# Patient Record
Sex: Female | Born: 1955 | Race: White | Hispanic: No | Marital: Married | Smoking: Never smoker
Health system: Southern US, Community
[De-identification: ages and names within clinical notes are randomized; demographics above are authoritative.]

## PROBLEM LIST (undated history)

## (undated) DIAGNOSIS — E049 Nontoxic goiter, unspecified: Secondary | ICD-10-CM

## (undated) DIAGNOSIS — E785 Hyperlipidemia, unspecified: Secondary | ICD-10-CM

## (undated) DIAGNOSIS — E6609 Other obesity due to excess calories: Secondary | ICD-10-CM

## (undated) DIAGNOSIS — D649 Anemia, unspecified: Secondary | ICD-10-CM

## (undated) DIAGNOSIS — I350 Nonrheumatic aortic (valve) stenosis: Secondary | ICD-10-CM

## (undated) DIAGNOSIS — I8393 Asymptomatic varicose veins of bilateral lower extremities: Secondary | ICD-10-CM

## (undated) DIAGNOSIS — D369 Benign neoplasm, unspecified site: Secondary | ICD-10-CM

## (undated) DIAGNOSIS — M1990 Unspecified osteoarthritis, unspecified site: Secondary | ICD-10-CM

## (undated) DIAGNOSIS — E042 Nontoxic multinodular goiter: Secondary | ICD-10-CM

## (undated) DIAGNOSIS — E039 Hypothyroidism, unspecified: Secondary | ICD-10-CM

## (undated) DIAGNOSIS — J189 Pneumonia, unspecified organism: Secondary | ICD-10-CM

## (undated) HISTORY — PX: WISDOM TOOTH EXTRACTION: SHX21

## (undated) HISTORY — DX: Anemia, unspecified: D64.9

## (undated) HISTORY — DX: Other obesity due to excess calories: E66.09

## (undated) HISTORY — DX: Hypothyroidism, unspecified: E03.9

## (undated) HISTORY — DX: Asymptomatic varicose veins of bilateral lower extremities: I83.93

## (undated) HISTORY — DX: Hyperlipidemia, unspecified: E78.5

## (undated) HISTORY — DX: Benign neoplasm, unspecified site: D36.9

## (undated) HISTORY — DX: Nontoxic multinodular goiter: E04.2

## (undated) HISTORY — DX: Nontoxic goiter, unspecified: E04.9

## (undated) HISTORY — DX: Nonrheumatic aortic (valve) stenosis: I35.0

## (undated) HISTORY — PX: COLONOSCOPY: SHX174

---

## 1998-06-29 ENCOUNTER — Other Ambulatory Visit: Admission: RE | Admit: 1998-06-29 | Discharge: 1998-06-29 | Payer: Self-pay | Admitting: Obstetrics and Gynecology

## 1998-09-20 ENCOUNTER — Ambulatory Visit (HOSPITAL_COMMUNITY): Admission: RE | Admit: 1998-09-20 | Discharge: 1998-09-20 | Payer: Self-pay | Admitting: Obstetrics and Gynecology

## 1998-09-20 ENCOUNTER — Encounter: Payer: Self-pay | Admitting: Obstetrics and Gynecology

## 1998-10-16 ENCOUNTER — Encounter: Payer: Self-pay | Admitting: Obstetrics and Gynecology

## 1998-10-16 ENCOUNTER — Ambulatory Visit (HOSPITAL_COMMUNITY): Admission: RE | Admit: 1998-10-16 | Discharge: 1998-10-16 | Payer: Self-pay | Admitting: Obstetrics and Gynecology

## 1999-08-08 ENCOUNTER — Other Ambulatory Visit: Admission: RE | Admit: 1999-08-08 | Discharge: 1999-08-08 | Payer: Self-pay | Admitting: Obstetrics and Gynecology

## 1999-10-17 ENCOUNTER — Encounter: Payer: Self-pay | Admitting: Obstetrics and Gynecology

## 1999-10-17 ENCOUNTER — Ambulatory Visit (HOSPITAL_COMMUNITY): Admission: RE | Admit: 1999-10-17 | Discharge: 1999-10-17 | Payer: Self-pay | Admitting: Obstetrics and Gynecology

## 2000-09-28 ENCOUNTER — Other Ambulatory Visit: Admission: RE | Admit: 2000-09-28 | Discharge: 2000-09-28 | Payer: Self-pay | Admitting: Obstetrics and Gynecology

## 2000-10-21 ENCOUNTER — Encounter: Payer: Self-pay | Admitting: Obstetrics and Gynecology

## 2000-10-21 ENCOUNTER — Ambulatory Visit (HOSPITAL_COMMUNITY): Admission: RE | Admit: 2000-10-21 | Discharge: 2000-10-21 | Payer: Self-pay | Admitting: Obstetrics and Gynecology

## 2001-10-20 ENCOUNTER — Other Ambulatory Visit: Admission: RE | Admit: 2001-10-20 | Discharge: 2001-10-20 | Payer: Self-pay | Admitting: Obstetrics and Gynecology

## 2001-10-26 ENCOUNTER — Ambulatory Visit (HOSPITAL_COMMUNITY): Admission: RE | Admit: 2001-10-26 | Discharge: 2001-10-26 | Payer: Self-pay | Admitting: Obstetrics and Gynecology

## 2001-10-26 ENCOUNTER — Encounter: Payer: Self-pay | Admitting: Obstetrics and Gynecology

## 2002-03-23 ENCOUNTER — Ambulatory Visit (HOSPITAL_COMMUNITY): Admission: RE | Admit: 2002-03-23 | Discharge: 2002-03-23 | Payer: Self-pay | Admitting: Obstetrics and Gynecology

## 2002-03-23 ENCOUNTER — Encounter: Payer: Self-pay | Admitting: Obstetrics and Gynecology

## 2002-10-31 ENCOUNTER — Encounter: Payer: Self-pay | Admitting: Obstetrics and Gynecology

## 2002-10-31 ENCOUNTER — Ambulatory Visit (HOSPITAL_COMMUNITY): Admission: RE | Admit: 2002-10-31 | Discharge: 2002-10-31 | Payer: Self-pay | Admitting: Internal Medicine

## 2002-11-16 ENCOUNTER — Other Ambulatory Visit: Admission: RE | Admit: 2002-11-16 | Discharge: 2002-11-16 | Payer: Self-pay | Admitting: Obstetrics and Gynecology

## 2003-11-20 ENCOUNTER — Other Ambulatory Visit: Admission: RE | Admit: 2003-11-20 | Discharge: 2003-11-20 | Payer: Self-pay | Admitting: Obstetrics and Gynecology

## 2003-11-23 ENCOUNTER — Ambulatory Visit (HOSPITAL_COMMUNITY): Admission: RE | Admit: 2003-11-23 | Discharge: 2003-11-23 | Payer: Self-pay | Admitting: Obstetrics and Gynecology

## 2005-02-11 ENCOUNTER — Other Ambulatory Visit: Admission: RE | Admit: 2005-02-11 | Discharge: 2005-02-11 | Payer: Self-pay | Admitting: Obstetrics and Gynecology

## 2005-03-10 ENCOUNTER — Ambulatory Visit (HOSPITAL_COMMUNITY): Admission: RE | Admit: 2005-03-10 | Discharge: 2005-03-10 | Payer: Self-pay | Admitting: Obstetrics and Gynecology

## 2006-04-08 ENCOUNTER — Ambulatory Visit (HOSPITAL_COMMUNITY): Admission: RE | Admit: 2006-04-08 | Discharge: 2006-04-08 | Payer: Self-pay | Admitting: Obstetrics and Gynecology

## 2007-05-12 ENCOUNTER — Ambulatory Visit (HOSPITAL_COMMUNITY): Admission: RE | Admit: 2007-05-12 | Discharge: 2007-05-12 | Payer: Self-pay | Admitting: Obstetrics and Gynecology

## 2007-05-17 ENCOUNTER — Encounter: Admission: RE | Admit: 2007-05-17 | Discharge: 2007-05-17 | Payer: Self-pay | Admitting: Obstetrics and Gynecology

## 2007-11-18 ENCOUNTER — Encounter: Admission: RE | Admit: 2007-11-18 | Discharge: 2007-11-18 | Payer: Self-pay | Admitting: Obstetrics and Gynecology

## 2008-05-22 ENCOUNTER — Encounter: Admission: RE | Admit: 2008-05-22 | Discharge: 2008-05-22 | Payer: Self-pay | Admitting: Obstetrics and Gynecology

## 2009-04-23 ENCOUNTER — Encounter: Admission: RE | Admit: 2009-04-23 | Discharge: 2009-04-23 | Payer: Self-pay | Admitting: Family Medicine

## 2009-06-21 ENCOUNTER — Encounter: Admission: RE | Admit: 2009-06-21 | Discharge: 2009-06-21 | Payer: Self-pay | Admitting: Obstetrics and Gynecology

## 2010-07-26 ENCOUNTER — Other Ambulatory Visit (HOSPITAL_COMMUNITY): Payer: Self-pay | Admitting: Obstetrics and Gynecology

## 2010-07-26 DIAGNOSIS — Z1231 Encounter for screening mammogram for malignant neoplasm of breast: Secondary | ICD-10-CM

## 2010-07-30 ENCOUNTER — Other Ambulatory Visit: Payer: Self-pay | Admitting: Obstetrics and Gynecology

## 2010-07-30 ENCOUNTER — Ambulatory Visit (HOSPITAL_COMMUNITY): Payer: Self-pay

## 2010-07-30 ENCOUNTER — Ambulatory Visit
Admission: RE | Admit: 2010-07-30 | Discharge: 2010-07-30 | Disposition: A | Discharge status: Home / Self Care | Payer: BC Managed Care – PPO | Source: Ambulatory Visit | Attending: Obstetrics and Gynecology | Admitting: Obstetrics and Gynecology

## 2010-07-30 DIAGNOSIS — Z1231 Encounter for screening mammogram for malignant neoplasm of breast: Secondary | ICD-10-CM

## 2010-07-31 ENCOUNTER — Ambulatory Visit: Payer: Self-pay

## 2011-05-05 ENCOUNTER — Ambulatory Visit: Payer: Self-pay | Admitting: Obstetrics and Gynecology

## 2011-06-04 ENCOUNTER — Ambulatory Visit (INDEPENDENT_AMBULATORY_CARE_PROVIDER_SITE_OTHER): Payer: BC Managed Care – PPO | Admitting: Obstetrics and Gynecology

## 2011-06-04 DIAGNOSIS — Z01419 Encounter for gynecological examination (general) (routine) without abnormal findings: Secondary | ICD-10-CM

## 2011-08-14 ENCOUNTER — Other Ambulatory Visit: Payer: Self-pay | Admitting: Obstetrics and Gynecology

## 2011-08-14 DIAGNOSIS — Z1231 Encounter for screening mammogram for malignant neoplasm of breast: Secondary | ICD-10-CM

## 2011-08-26 ENCOUNTER — Ambulatory Visit
Admission: RE | Admit: 2011-08-26 | Discharge: 2011-08-26 | Disposition: A | Discharge status: Home / Self Care | Payer: BC Managed Care – PPO | Source: Ambulatory Visit | Attending: Obstetrics and Gynecology | Admitting: Obstetrics and Gynecology

## 2011-08-26 DIAGNOSIS — Z1231 Encounter for screening mammogram for malignant neoplasm of breast: Secondary | ICD-10-CM

## 2011-08-28 ENCOUNTER — Other Ambulatory Visit: Payer: Self-pay | Admitting: Obstetrics and Gynecology

## 2011-08-28 DIAGNOSIS — R928 Other abnormal and inconclusive findings on diagnostic imaging of breast: Secondary | ICD-10-CM

## 2011-09-05 ENCOUNTER — Ambulatory Visit
Admission: RE | Admit: 2011-09-05 | Discharge: 2011-09-05 | Disposition: A | Discharge status: Home / Self Care | Payer: BC Managed Care – PPO | Source: Ambulatory Visit | Attending: Obstetrics and Gynecology | Admitting: Obstetrics and Gynecology

## 2011-09-05 DIAGNOSIS — R928 Other abnormal and inconclusive findings on diagnostic imaging of breast: Secondary | ICD-10-CM

## 2011-09-25 ENCOUNTER — Encounter: Payer: Self-pay | Admitting: Obstetrics and Gynecology

## 2012-09-20 ENCOUNTER — Other Ambulatory Visit: Payer: Self-pay

## 2012-09-20 DIAGNOSIS — Z1231 Encounter for screening mammogram for malignant neoplasm of breast: Secondary | ICD-10-CM

## 2012-10-12 ENCOUNTER — Ambulatory Visit
Admission: RE | Admit: 2012-10-12 | Discharge: 2012-10-12 | Disposition: A | Discharge status: Home / Self Care | Payer: BC Managed Care – PPO | Source: Ambulatory Visit

## 2012-10-12 DIAGNOSIS — Z1231 Encounter for screening mammogram for malignant neoplasm of breast: Secondary | ICD-10-CM

## 2013-08-18 ENCOUNTER — Other Ambulatory Visit: Payer: Self-pay | Admitting: Family Medicine

## 2013-08-18 ENCOUNTER — Ambulatory Visit
Admission: RE | Admit: 2013-08-18 | Discharge: 2013-08-18 | Disposition: A | Discharge status: Home / Self Care | Payer: BC Managed Care – PPO | Source: Ambulatory Visit | Attending: Family Medicine | Admitting: Family Medicine

## 2013-08-18 DIAGNOSIS — S93401A Sprain of unspecified ligament of right ankle, initial encounter: Secondary | ICD-10-CM

## 2013-09-21 ENCOUNTER — Ambulatory Visit
Admission: RE | Admit: 2013-09-21 | Discharge: 2013-09-21 | Disposition: A | Discharge status: Home / Self Care | Payer: BC Managed Care – PPO | Source: Ambulatory Visit | Attending: Family Medicine | Admitting: Family Medicine

## 2013-09-21 ENCOUNTER — Other Ambulatory Visit: Payer: Self-pay | Admitting: Family Medicine

## 2013-09-21 DIAGNOSIS — M546 Pain in thoracic spine: Secondary | ICD-10-CM

## 2013-10-07 ENCOUNTER — Other Ambulatory Visit: Payer: Self-pay

## 2013-10-07 DIAGNOSIS — Z1231 Encounter for screening mammogram for malignant neoplasm of breast: Secondary | ICD-10-CM

## 2013-10-14 ENCOUNTER — Ambulatory Visit
Admission: RE | Admit: 2013-10-14 | Discharge: 2013-10-14 | Disposition: A | Discharge status: Home / Self Care | Payer: BC Managed Care – PPO | Source: Ambulatory Visit

## 2013-10-14 DIAGNOSIS — Z1231 Encounter for screening mammogram for malignant neoplasm of breast: Secondary | ICD-10-CM

## 2014-10-09 ENCOUNTER — Encounter (INDEPENDENT_AMBULATORY_CARE_PROVIDER_SITE_OTHER): Payer: BC Managed Care – PPO | Admitting: Ophthalmology

## 2014-10-09 DIAGNOSIS — H31003 Unspecified chorioretinal scars, bilateral: Secondary | ICD-10-CM | POA: Diagnosis not present

## 2014-10-09 DIAGNOSIS — H43813 Vitreous degeneration, bilateral: Secondary | ICD-10-CM

## 2017-09-08 ENCOUNTER — Other Ambulatory Visit (HOSPITAL_COMMUNITY): Payer: Self-pay | Admitting: Family Medicine

## 2017-09-08 DIAGNOSIS — R011 Cardiac murmur, unspecified: Secondary | ICD-10-CM

## 2017-09-10 ENCOUNTER — Ambulatory Visit (HOSPITAL_COMMUNITY): Payer: BC Managed Care – PPO | Attending: Cardiology

## 2017-09-10 ENCOUNTER — Other Ambulatory Visit: Payer: Self-pay

## 2017-09-10 ENCOUNTER — Encounter (INDEPENDENT_AMBULATORY_CARE_PROVIDER_SITE_OTHER): Payer: Self-pay

## 2017-09-10 DIAGNOSIS — I080 Rheumatic disorders of both mitral and aortic valves: Secondary | ICD-10-CM | POA: Insufficient documentation

## 2017-09-10 DIAGNOSIS — R011 Cardiac murmur, unspecified: Secondary | ICD-10-CM

## 2017-09-10 DIAGNOSIS — I77810 Thoracic aortic ectasia: Secondary | ICD-10-CM | POA: Insufficient documentation

## 2017-09-18 DIAGNOSIS — I350 Nonrheumatic aortic (valve) stenosis: Secondary | ICD-10-CM | POA: Insufficient documentation

## 2017-09-21 ENCOUNTER — Encounter: Payer: Self-pay | Admitting: Cardiovascular Disease

## 2017-09-21 ENCOUNTER — Ambulatory Visit (INDEPENDENT_AMBULATORY_CARE_PROVIDER_SITE_OTHER): Payer: BC Managed Care – PPO | Admitting: Cardiovascular Disease

## 2017-09-21 VITALS — BP 127/89 | HR 80 | Ht 66.0 in | Wt 218.0 lb

## 2017-09-21 DIAGNOSIS — E6609 Other obesity due to excess calories: Secondary | ICD-10-CM

## 2017-09-21 DIAGNOSIS — I350 Nonrheumatic aortic (valve) stenosis: Secondary | ICD-10-CM | POA: Diagnosis not present

## 2017-09-21 DIAGNOSIS — E039 Hypothyroidism, unspecified: Secondary | ICD-10-CM | POA: Diagnosis not present

## 2017-09-21 DIAGNOSIS — Z6835 Body mass index (BMI) 35.0-35.9, adult: Secondary | ICD-10-CM

## 2017-09-21 DIAGNOSIS — E7800 Pure hypercholesterolemia, unspecified: Secondary | ICD-10-CM

## 2017-09-21 DIAGNOSIS — Z01812 Encounter for preprocedural laboratory examination: Secondary | ICD-10-CM

## 2017-09-21 MED ORDER — METOPROLOL TARTRATE 50 MG PO TABS: ORAL_TABLET | ORAL | 0 refills | Status: DC

## 2017-09-21 NOTE — Patient Instructions (Signed)
Medication Instructions:  Your physician recommends that you continue on your current medications as directed. Please refer to the Current Medication list given to you today.  Labwork: Please return for labs 1 WEEK PRIOR TO CT (BMET)  Our in office lab hours are Monday-Friday 8:00-4:00, closed for lunch 12:45-1:45 pm.  No appointment needed.  Testing/Procedures: Your physician has requested that you have an echocardiogram in 3 months. Echocardiography is a painless test that uses sound waves to create images of your heart. It provides your doctor with information about the size and shape of your heart and how well your heart's chambers and valves are working. This procedure takes approximately one hour. There are no restrictions for this procedure. This will be done at our [facility] location:  [place] has requested that you have cardiac CT. Cardiac computed tomography (CT) is a painless test that uses an x-ray machine to take clear, detailed pictures of your heart. For further information please visit [email or web address]. Please follow instruction sheet as given.  Follow-Up: 3 months (after echo) with Dr. Claiborne Billings  Any Other Special Instructions Will Be Listed Below (If Applicable).     If you need a refill on your cardiac medications before your next appointment, please call your pharmacy.

## 2017-09-21 NOTE — Progress Notes (Signed)
Cardiology Office Note    Date:  09/26/2017   ID:  Megan Duke, DOB 05-07-1955, MRN [id number]  PCP:  Harlan Stains, MD  Cardiologist:  Shelva Majestic, MD   Chief Complaint  Patient presents with  . New Cardiology evaluation referred through the courtesy of Dr. Harlan Stains    History of Present Illness:  Megan Duke is a 62 y.o. female who is referred by Dr. Harlan Stains after an echo Doppler study has suggested severe aortic valve stenosis.   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  She has been active her whole life.  She cuts the grass.  She walks.  She denies any symptoms of chest pain, shortness of breath, presyncope or syncope.  She states that she had previously never been told of having any significant heart murmur but the murmur was first mentioned when she was evaluated in [place] approximately 1 year ago.  No work-up was undertaken.  She recently saw  Dr. Dema Severin and her onmost recent evaluation she was found to have a 2/6 systolic murmur radiating to her carotids.  At that time she states that she had been walking at least 3 times per week for 30 to 45 minutes without change compared to previously.  She underwent a 2D echo Doppler study on September 10, 2017.which showed normal LV function with mild LVH.  There was grade 1 diastolic dysfunction.  Aortic valve had reduced excursion and had moderately calcified leaflets.  Mean gradient was 40, peak gradient 67.  The aortic root was 39 mm.  She had mild aortic insufficiency.  Pulmonary pressures were normal.  Because of her echocardiographic findings, she now presents for cardiology evaluation.  Patient admits to purposeful weight loss over the past year.  She denies any chest pain, change in exercise tolerance, presyncope/syncope, PND orthopnea or exertional dyspnea.  Past Medical History:  Diagnosis Date  . Adenomatous polyp   . Anemia   . Dyslipidemia   . Hypothyroidism   .  Multinodular goiter   . Non morbid obesity due to excess calories   . Nontoxic goiter   . Severe aortic stenosis   . Varicose veins of both lower extremities     Past Surgical History:  Procedure Laterality Date  . COLONOSCOPY    . WISDOM TOOTH EXTRACTION      Current Medications: Outpatient Medications Prior to Visit  Medication Sig Dispense Refill  . levothyroxine (SYNTHROID, LEVOTHROID) 50 MCG tablet Take 1 tablet by mouth every morning.     No facility-administered medications prior to visit.      Allergies:   No drug allergies.  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 Marital status: Married    Spouse name: Not on file  . Number of children: Not on file  . Years of education: Not on file  . Highest education level: Not on file  Occupational History  . Not on file  Social Needs  . Financial resource strain: Not on file  . Food insecurity:    Worry: Not on file    Inability: Not on file  . Transportation needs:    Medical: Not on file    Non-medical: Not on file  Tobacco Use  . Smoking status: Never Smoker  . Smokeless tobacco: Never Used  Substance and Sexual Activity  . Alcohol use: Not on file  . Drug use: Not on file  . Sexual activity: Not on  file  Lifestyle  . Physical activity:    Days per week: Not on file    Minutes per session: Not on file  . Stress: Not on file  Relationships  . Social connections:    Talks on phone: Not on file    Gets together: Not on file    Attends religious service: Not on file    Active member of club or organization: Not on file    Attends meetings of clubs or organizations: Not on file    Relationship status: Not on file  Other Topics Concern  . Not on file  Social History Narrative  . Not on file    Additional social history is notable that she was born in [place].  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  She attended [employer]  for college.  Family History: Her mother died at age 16 with lung cancer.  Father is living at age 41.  She has a brother at age 6, and 2 sisters ages 26 and 40.  The patient's family history includes Lung cancer in her mother; Stroke in her paternal grandmother.  She has 3 children ages 4, 92, and 80.  ROS General: Negative; No fevers, chills, or night sweats;  HEENT: Negative; No changes in vision or hearing, sinus congestion, difficulty swallowing Pulmonary: Negative; No cough, wheezing, shortness of breath, hemoptysis Cardiovascular: see HPI;  H/o varicose veins GI: Negative; No nausea, vomiting, diarrhea, or abdominal pain GU: Negative; No dysuria, hematuria, or difficulty voiding Musculoskeletal: Negative; no myalgias, joint pain, or weakness Hematologic/Oncology: Negative; no easy bruising, bleeding Endocrine:  positive for hypothyroidism on levothyroxine; no diabetes Neuro: Negative; no changes in balance, headaches Skin: Negative; No rashes or skin lesions Psychiatric: Negative; No behavioral problems, depression Sleep: Negative; No snoring, daytime sleepiness, hypersomnolence, bruxism, restless legs, hypnogognic hallucinations, no cataplexy Other comprehensive 14 point system review is negative.   PHYSICAL EXAM:   VS:  BP 127/89 (BP Location: Left Arm, Cuff Size: Large)   Pulse 80   Ht 5\' 6"  (1.676 m)   Wt 218 lb (98.9 kg)   BMI 35.19 kg/m     Repeat blood pressure by me was 124/84  Wt Readings from Last 3 Encounters:  09/21/17 218 lb (98.9 kg)    General: Alert, oriented, no distress.  Skin: normal turgor, no rashes, warm and dry HEENT: Normocephalic, atraumatic. Pupils equal round and reactive to light; sclera anicteric; extraocular muscles intact; Fundi without hemorrhages or exudates.  Discs flat. Nose without nasal septal hypertrophy Mouth/Parynx benign; Mallinpatti scale 2 Neck: No JVD, soft transmitted murmur; she continues to be on levothyroxine for  hypothyroidism with TSH being normal. Lungs: clear to ausculatation and percussion; no wheezing or rales Chest wall: without tenderness to palpitation Heart: PMI not displaced, RRR, s1 s2 normal, 2/6 late early to mid peaking systolic murmur with transmission to her carotids.  No appreciable aortic insufficiency on exam; no rubs, gallops, thrills, or heaves Abdomen: soft, nontender; no hepatosplenomehaly, BS+; abdominal aorta nontender and not dilated by palpation. Back: no CVA tenderness Pulses 2+ Musculoskeletal: full range of motion, normal strength, no joint deformities Extremities: Mild varicose veins.  No clubbing cyanosis or edema, Homan's sign negative  Neurologic: grossly nonfocal; Cranial nerves grossly wnl Psychologic: Normal mood and affect   Studies/Labs Reviewed:   EKG:  EKG is ordered today.  ECG (independently read by me): Sinus rhythm at 80 bpm.  Right bundle branch block with repolarization changes.  No  evidence for LVH or LV strain.  Recent Labs: No flowsheet data found.   No flowsheet data found.  No flowsheet data found. No results found for: MCV No results found for: TSH No results found for: HGBA1C   BNP No results found for: BNP  ProBNP No results found for: PROBNP   Lipid Panel  No results found for: CHOL, TRIG, HDL, CHOLHDL, VLDL, LDLCALC, LDLDIRECT   RADIOLOGY: No results found.   Additional studies/ records that were reviewed today include:  I reviewed the office records of Dr. Harlan Stains as well as her most recent echo Doppler data and laboratory.  July 2019 lipid studies: Total cholesterol 194, HDL 45, LDL 125, triglycerides 121.  Creatinine 0.84.  Potassium 4.6.  TSH 1.89. ALT 21.  Hemoglobin 13.2.   ASSESSMENT:    1. Severe aortic stenosis   2. Pre-procedure lab exam   3. Hypothyroidism, unspecified type   4. Pure hypercholesterolemia   5. Class 2 obesity due to excess calories with body mass index (BMI) of 35.0 to 35.9 in  adult, unspecified whether serious comorbidity present      PLAN:  Megan Duke is a very pleasant 62 year old female who is been unaware of any long-standing history of a cardiac murmur.  She has a history of hypothyroidism and has been on levothyroxine replacement.  Recent laboratory has also suggested hyperlipidemia.  She has a history of obesity and has lost over 30 pounds over the past year.  She states that she was first told of possibly having a mild murmur when she was seen in [place] approximately 1 year ago.  Her most recent echo Doppler study is suggestive of severe aortic valve stenosis with a mean gradient of 40 and peak instantaneous gradient of 67 mmHg.  On exam, the patient's murmur is not late peaking and is 2/6 and at most later early to mid peaking.  Her ECG does not demonstrate any significant LVH or significant LV strain.  Upon intense questioning, the patient states that she is entirely asymptomatic.  She continues to walk 30 to 45 minutes 3 days/week and has not noticed any change in pace or development of symptoms.  She cuts the grass with a push lawnmower and has not noticed a difference.  I had a long discussion with both she and her husband today.  We discussed the natural history of aortic valve stenosis and  in particular discussed symptoms associated with AS.  Her lipid studies demonstrate mild elevation with an LDL of 125.  We discussed potential development of symptoms with AS sooner if there is associated coronary obstructive disease.  We discussed potential ultimate need for cardiac catheterization.  However, with her completely asymptomatic status we discussed data regarding very close follow-up rather than immediate cardiac catheterization and surgery.  I will schedule her for CT coronary Angio to assess potential for subclinical atherosclerosis.  I also have recommended a follow-up echo Doppler study to be done in 3 months in October for initial reassessment.  I  will see her back in the office in follow-up at that time and further recommendations will be made depending upon her clinical status and echocardiographic and CT assessment.    Medication Adjustments/Labs and Tests Ordered: Current medicines are reviewed at length with the patient today.  Concerns regarding medicines are outlined above.  Medication changes, Labs and Tests ordered today are listed in the Patient Instructions below. Patient Instructions  Medication Instructions:  Your physician recommends that you continue on  your current medications as directed. Please refer to the Current Medication list given to you today.  Labwork: Please return for labs 1 WEEK PRIOR TO CT (BMET)  Our in office lab hours are Monday-Friday 8:00-4:00, closed for lunch 12:45-1:45 pm.  No appointment needed.  Testing/Procedures: Your physician has requested that you have an echocardiogram in 3 months. Echocardiography is a painless test that uses sound waves to create images of your heart. It provides your doctor with information about the size and shape of your heart and how well your heart's chambers and valves are working. This procedure takes approximately one hour. There are no restrictions for this procedure. This will be done at our [facility] location:  [place] has requested that you have cardiac CT. Cardiac computed tomography (CT) is a painless test that uses an x-ray machine to take clear, detailed pictures of your heart. For further information please visit [email or web address]. Please follow instruction sheet as given.  Follow-Up: 3 months (after echo) with Dr. Claiborne Billings  Any Other Special Instructions Will Be Listed Below (If Applicable).     If you need a refill on your cardiac medications before your next appointment, please call your pharmacy.      Signed, Shelva Majestic, MD  09/26/2017 2:22 PM    [facility] [address], [place], [place], [place]  [address] Phone: [phone]

## 2017-09-26 ENCOUNTER — Encounter: Payer: Self-pay | Admitting: Cardiovascular Disease

## 2017-10-13 LAB — BASIC METABOLIC PANEL
BUN / CREAT RATIO: 18 (ref 12–28)
BUN: 15 mg/dL (ref 8–27)
CHLORIDE: 102 mmol/L (ref 96–106)
CO2: 25 mmol/L (ref 20–29)
Calcium: 9.6 mg/dL (ref 8.7–10.3)
Creatinine, Ser: 0.84 mg/dL (ref 0.57–1.00)
GFR calc non Af Amer: 76 mL/min/{1.73_m2} (ref >59)
GFR, EST AFRICAN AMERICAN: 87 mL/min/{1.73_m2} (ref >59)
GLUCOSE: 74 mg/dL (ref 65–99)
POTASSIUM: 4.5 mmol/L (ref 3.5–5.2)
SODIUM: 141 mmol/L (ref 134–144)

## 2017-10-14 ENCOUNTER — Ambulatory Visit (HOSPITAL_COMMUNITY)
Admission: RE | Admit: 2017-10-14 | Discharge: 2017-10-14 | Disposition: A | Discharge status: Home / Self Care | Payer: BC Managed Care – PPO | Source: Ambulatory Visit | Attending: Cardiovascular Disease | Admitting: Cardiovascular Disease

## 2017-10-14 ENCOUNTER — Encounter (HOSPITAL_COMMUNITY): Payer: Self-pay

## 2017-10-14 DIAGNOSIS — I350 Nonrheumatic aortic (valve) stenosis: Secondary | ICD-10-CM | POA: Insufficient documentation

## 2017-10-14 DIAGNOSIS — I712 Thoracic aortic aneurysm, without rupture: Secondary | ICD-10-CM | POA: Diagnosis not present

## 2017-10-14 DIAGNOSIS — Q231 Congenital insufficiency of aortic valve: Secondary | ICD-10-CM | POA: Insufficient documentation

## 2017-10-14 MED ORDER — NITROGLYCERIN 0.4 MG SL SUBL
0.4000 mg | SUBLINGUAL_TABLET | Freq: Once | SUBLINGUAL | Status: AC
  Administered 2017-10-14: 0.4 mg via SUBLINGUAL
  Filled 2017-10-14: qty 25

## 2017-10-14 MED ORDER — IOPAMIDOL (ISOVUE-370) INJECTION 76%
100.0000 mL | Freq: Once | INTRAVENOUS | Status: AC | PRN
Start: 2017-10-14 — End: 2017-10-14
  Administered 2017-10-14: 90 mL via INTRAVENOUS

## 2017-10-14 MED ORDER — IOPAMIDOL (ISOVUE-370) INJECTION 76%
INTRAVENOUS | Status: AC
  Filled 2017-10-14: qty 100

## 2017-10-14 MED ORDER — NITROGLYCERIN 0.4 MG SL SUBL
SUBLINGUAL_TABLET | SUBLINGUAL | Status: AC
  Administered 2017-10-14: 0.4 mg via SUBLINGUAL
  Filled 2017-10-14: qty 1

## 2017-12-22 ENCOUNTER — Ambulatory Visit (HOSPITAL_COMMUNITY): Payer: BC Managed Care – PPO | Attending: Cardiovascular Disease

## 2017-12-22 ENCOUNTER — Other Ambulatory Visit: Payer: Self-pay

## 2017-12-22 DIAGNOSIS — I350 Nonrheumatic aortic (valve) stenosis: Secondary | ICD-10-CM | POA: Insufficient documentation

## 2017-12-24 ENCOUNTER — Encounter: Payer: Self-pay | Admitting: Cardiovascular Disease

## 2017-12-24 ENCOUNTER — Ambulatory Visit (INDEPENDENT_AMBULATORY_CARE_PROVIDER_SITE_OTHER): Payer: BC Managed Care – PPO | Admitting: Cardiovascular Disease

## 2017-12-24 VITALS — BP 138/88 | HR 73 | Ht 66.0 in | Wt 221.0 lb

## 2017-12-24 DIAGNOSIS — I350 Nonrheumatic aortic (valve) stenosis: Secondary | ICD-10-CM | POA: Diagnosis not present

## 2017-12-24 DIAGNOSIS — E039 Hypothyroidism, unspecified: Secondary | ICD-10-CM | POA: Diagnosis not present

## 2017-12-24 DIAGNOSIS — E6609 Other obesity due to excess calories: Secondary | ICD-10-CM

## 2017-12-24 DIAGNOSIS — I77810 Thoracic aortic ectasia: Secondary | ICD-10-CM | POA: Diagnosis not present

## 2017-12-24 DIAGNOSIS — Z6835 Body mass index (BMI) 35.0-35.9, adult: Secondary | ICD-10-CM

## 2017-12-24 NOTE — Progress Notes (Signed)
Cardiology Office Note    Date:  12/24/2017   ID:  Megan Duke, DOB [birth year], MRN [id number]  PCP:  Harlan Stains, MD  Cardiologist:  Shelva Majestic, MD   Chief Complaint  Patient presents with  . New Cardiology evaluation referred through the courtesy of Dr. Harlan Stains    History of Present Illness:  Megan Duke is a 62 y.o. female who is referred by Dr. Harlan Stains after an echo Doppler study has suggested severe aortic valve stenosis.   Ms. 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  She has been active her whole life.  She cuts the grass.  She walks.  She denies any symptoms of chest pain, shortness of breath, presyncope or syncope.  She states that she had previously never been told of having any significant heart murmur but the murmur was first mentioned when she was evaluated in [place] approximately 1 year ago.  No work-up was undertaken.  She recently saw  Dr. Dema Severin and her onmost recent evaluation she was found to have a 2/6 systolic murmur radiating to her carotids.  At that time she states that she had been walking at least 3 times per week for 30 to 45 minutes without change compared to previously.  She underwent a 2D echo Doppler study on September 10, 2017.which showed normal LV function with mild LVH.  There was grade 1 diastolic dysfunction.  Aortic valve had reduced excursion and had moderately calcified leaflets.  Mean gradient was 40, peak gradient 67.  The aortic root was 39 mm.  She had mild aortic insufficiency.  Pulmonary pressures were normal.  Because of her echocardiographic findings, she presented to me for initial evaluation September 21, 2017.  During that evaluation, I had a lengthy discussion with her and reviewed the findings of her July echo Doppler study.  At that time we discussed the natural history of aortic valve stenosis in particular discussed symptoms associated with a asked.  She was fairly emphatic that she  was completely asymptomatic and was walking 30 to 45 minutes cutting the grass without any symptomatology.  We discussed potential development of symptoms with a as sooner if there is associated coronary obstructive disease and for this reason I recommended that she undergo a CT coronary angiogram to evaluate for this and also recommended close follow-up with a repeat echo Doppler study in 3 months.  The CT Angio demonstrated mild dilation of the aortic root at the sinus level (42 mm and mild ascending aortic aneurysm at 41 mm.  Coronary calcium score was 0.  Most vessels had 0 plaque but only minimal nonobstructive irregularities were noted in one diagonal branch of the LAD.  Repeat echo Doppler study December 22, 2017 continues to show preserved LV function with an EF of 60 to 65%.  However, and over 67-month.  Her mean gradient has increased from 40 to 67 mm and her peak instantaneous gradient has increased from 51 to 89 mmHg.  She has continued to cut the grass since her husband had torn his Achilles and is in a cast.  She has not been walking as fast that she had in the past.  She denies presyncope or syncope.  She denies chest tightness.  She presents for reevaluation.  Past Medical History:  Diagnosis Date  . Adenomatous polyp   . Anemia   . Dyslipidemia   . Hypothyroidism   . Multinodular goiter   . Non morbid obesity due to excess  calories   . Nontoxic goiter   . Severe aortic stenosis   . Varicose veins of both lower extremities     Past Surgical History:  Procedure Laterality Date  . COLONOSCOPY    . WISDOM TOOTH EXTRACTION      Current Medications: Outpatient Medications Prior to Visit  Medication Sig Dispense Refill  . levothyroxine (SYNTHROID, LEVOTHROID) 50 MCG tablet Take 1 tablet by mouth every morning.    . metoprolol tartrate (LOPRESSOR) 50 MG tablet Take 50 mg (1 tablet) ONE hour prior to CT 1 tablet 0   No facility-administered medications prior to visit.       Allergies:   No drug allergies.  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 Marital status: Married    Spouse name: Not on file  . Number of children: Not on file  . Years of education: Not on file  . Highest education level: Not on file  Occupational History  . Not on file  Social Needs  . Financial resource strain: Not on file  . Food insecurity:    Worry: Not on file    Inability: Not on file  . Transportation needs:    Medical: Not on file    Non-medical: Not on file  Tobacco Use  . Smoking status: Never Smoker  . Smokeless tobacco: Never Used  Substance and Sexual Activity  . Alcohol use: Not on file  . Drug use: Not on file  . Sexual activity: Not on file  Lifestyle  . Physical activity:    Days per week: Not on file    Minutes per session: Not on file  . Stress: Not on file  Relationships  . Social connections:    Talks on phone: Not on file    Gets together: Not on file    Attends religious service: Not on file    Active member of club or organization: Not on file    Attends meetings of clubs or organizations: Not on file    Relationship status: Not on file  Other Topics Concern  . Not on file  Social History Narrative  . Not on file    Additional social history is notable that she was born in [place].  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  She attended [employer] for college.  Family History: Her mother died at age 2 with lung cancer.  Father is living at age 58.  She has a brother at age 47, and 2 sisters ages 69 and 51.  The patient's family history includes Lung cancer in her mother; Stroke in her paternal grandmother.  She has 3 children ages 86, 40, and 68.  ROS General: Negative; No fevers, chills, or night sweats;  HEENT: Negative; No changes in vision or hearing, sinus congestion, difficulty swallowing Pulmonary: Negative; No cough, wheezing, shortness of  breath, hemoptysis Cardiovascular: see HPI;  H/o varicose veins GI: Negative; No nausea, vomiting, diarrhea, or abdominal pain GU: Negative; No dysuria, hematuria, or difficulty voiding Musculoskeletal: Negative; no myalgias, joint pain, or weakness Hematologic/Oncology: Negative; no easy bruising, bleeding Endocrine:  positive for hypothyroidism on levothyroxine; no diabetes Neuro: Negative; no changes in balance, headaches Skin: Negative; No rashes or skin lesions Psychiatric: Negative; No behavioral problems, depression Sleep: Negative; No snoring, daytime sleepiness, hypersomnolence, bruxism, restless legs, hypnogognic hallucinations, no cataplexy Other comprehensive 14 point system review is negative.   PHYSICAL EXAM:   VS:  BP  138/88   Pulse 73   Ht 5\' 6"  (1.676 m)   Wt 221 lb (100.2 kg)   BMI 35.67 kg/m     Repeat blood pressure by me was 124/84  Wt Readings from Last 3 Encounters:  12/24/17 221 lb (100.2 kg)  09/21/17 218 lb (98.9 kg)    General: Alert, oriented, no distress.  Skin: normal turgor, no rashes, warm and dry HEENT: Normocephalic, atraumatic. Pupils equal round and reactive to light; sclera anicteric; extraocular muscles intact;  Nose without nasal septal hypertrophy Mouth/Parynx benign; Mallinpatti scale Neck: No JVD, transmitted murmurs to her carotids bilaterally. Lungs: clear to ausculatation and percussion; no wheezing or rales Chest wall: without tenderness to palpitation Heart: PMI not displaced, RRR, s1 s2 normal, 2/6 late peaking systolic murmur system with aortic valve stenosis, no diastolic murmur, no rubs, gallops, thrills, or heaves Abdomen: soft, nontender; no hepatosplenomehaly, BS+; abdominal aorta nontender and not dilated by palpation. Back: no CVA tenderness Pulses 2+ Musculoskeletal: full range of motion, normal strength, no joint deformities Extremities: large legs without significant edema ; no clubbing cyanosis , Homan's sign  negative  Neurologic: grossly nonfocal; Cranial nerves grossly wnl Psychologic: Normal mood and affect   Studies/Labs Reviewed:   EKG:  EKG is ordered today.  ECG (independently read by me): Normal sinus rhythm at 73 bpm.  Right bundle branch block with repolarization changes.  Normal intervals.  No ectopy.  September 21, 2017 ECG (independently read by me): Sinus rhythm at 80 bpm.  Right bundle branch block with repolarization changes.  No evidence for LVH or LV strain.  Recent Labs: BMP Latest Ref Rng & Units 10/13/2017  Glucose 65 - 99 mg/dL 74  BUN 8 - 27 mg/dL 15  Creatinine 0.57 - 1.00 mg/dL 0.84  BUN/Creat Ratio 12 - 28 18  Sodium 134 - 144 mmol/L 141  Potassium 3.5 - 5.2 mmol/L 4.5  Chloride 96 - 106 mmol/L 102  CO2 20 - 29 mmol/L 25  Calcium 8.7 - 10.3 mg/dL 9.6     No flowsheet data found.  No flowsheet data found. No results found for: MCV No results found for: TSH No results found for: HGBA1C   BNP No results found for: BNP  ProBNP No results found for: PROBNP   Lipid Panel  No results found for: CHOL, TRIG, HDL, CHOLHDL, VLDL, LDLCALC, LDLDIRECT   RADIOLOGY: No results found.   Additional studies/ records that were reviewed today include:  I reviewed the office records of Dr. Harlan Stains as well as her most recent echo Doppler data and laboratory.  July 2019 lipid studies: Total cholesterol 194, HDL 45, LDL 125, triglycerides 121.  Creatinine 0.84.  Potassium 4.6.  TSH 1.89. ALT 21.  Hemoglobin 13.2  .------------------------------------------------------------------- 09/10/2017 ECHO Study Conclusions  - Left ventricle: The cavity size was normal. Wall thickness was   increased in a pattern of mild LVH. Systolic function was normal.   The estimated ejection fraction was in the range of 55% to 60%.   Wall motion was normal; there were no regional wall motion   abnormalities. Doppler parameters are consistent with abnormal   left ventricular  relaxation (grade 1 diastolic dysfunction). - Aortic valve: Valve mobility was restricted. There was severe   stenosis. There was mild regurgitation. - Aortic root: The aortic root was mildly dilated. - Ascending aorta: The ascending aorta was mildly dilated. - Mitral valve: There was mild regurgitation.  Impressions:  - Normal LV systolic function; mild diastolic dysfunction;  mild   LVH; calcified aortic valve with severe AS (mean gradient 40   mmHg) and mild AI; mildly dilated aortic root/ascending aorta;   mild MR.  ________________________________________________________ October 14, 2017 CT CORONARY ANGIO CLINICAL DATA:  61 year old female with severe aortic stenosis being evaluated for AVR.  EXAM: Cardiac TAVR CT  TECHNIQUE: The patient was scanned on a [employer]. A 120 kV retrospective scan was triggered in the descending thoracic aorta at 111 HU's. Gantry rotation speed was 250 msecs and collimation was .6 mm. No beta blockade or nitro were given. The 3D data set was reconstructed in 5% intervals of the R-R cycle. Systolic and diastolic phases were analyzed on a dedicated work station using MPR, MIP and VRT modes. The patient received 80 cc of contrast.  FINDINGS: Aortic Valve: Functionally bicuspid aortic valve with co-joined left and right coronary leaflets. Leaflets are severely thickened and calcified with severely restricted leaflet opening in systole. Aortic valve is dilated at the sinus level with maximum diameter 42 mm.  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 Normal size of the aortic arch and descending aorta. Mild diffuse atheroma and calcifications. No dissection.  Sinotubular Junction: 38 x 36 mm  Ascending Thoracic Aorta: 41 x 41 mm  Aortic Arch: 28 x 25 mm  Descending Thoracic Aorta: 23 x 22 mm  Sinus of Valsalva  Measurements:  Non-coronary: 41 mm  Right -coronary: 37 mm  Left -coronary:  Coronary Artery Height above Annulus:  Left Main: 14 mm  Right Coronary: 19 mm  Virtual Basal Annulus Measurements:  Maximum/Minimum Diameter: 33.6 x 27.8 mm  Mean Diameter: 29.8 mm  Perimeter: 98.1 mm  Area: 697 mm2  Coronary Arteries: Normal coronary origin. Right dominance. Calcium score is 0.  RCA is a large artery that gives rise to PDA and PLA. There is no plaque.  Left main gives rise to LAD and LCX arteries and has no plaque.  LAD gives rise to one diagonal artery and has minimal non-obstructive irregularities.  LCX is a non-dominant artery that gives rise to one OM branch and has no plaque.  IMPRESSION: 1. 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 Leaflets are severely thickened and calcified with severely restricted leaflet opening in systole.  2. 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  3. Normal coronary origin. Right dominance. Calcium score is 0. No CAD.  4. No thrombus in the left atrial appendage.    ------------------------------------------------------------------- 12/22/2017 ECHO Study Conclusions  - Left ventricle: The cavity size was normal. Wall thickness was   increased in a pattern of mild LVH. There was focal basal   hypertrophy. Systolic function was normal. The estimated ejection   fraction was in the range of 60% to 65%. Wall motion was normal;   there were no regional wall motion abnormalities. Features are   consistent with a pseudonormal left ventricular filling pattern,   with concomitant abnormal relaxation and increased filling   pressure (grade 2 diastolic dysfunction). - Aortic valve: Mildly to moderately calcified annulus. Moderately   thickened, moderately calcified leaflets. There was severe    stenosis. There was mild to moderate regurgitation. - Mitral valve: There was mild regurgitation.  ASSESSMENT:    1. Severe aortic stenosis   2. Class 2 obesity due to excess calories with body mass  index (BMI) of 35.0 to 35.9 in adult, unspecified whether serious comorbidity present   3. Hypothyroidism, unspecified type   4. Aortic root dilatation [facility])      PLAN:  Ms. Vedha Tercero is a very pleasant 62 year old female who is been unaware of any long-standing history of a cardiac murmur.  She has a history of hypothyroidism and has been on levothyroxine replacement.  Recent laboratory has also suggested hyperlipidemia.  She has a history of obesity and has lost over 30 pounds over the past year.  She was first told of possibly having a mild murmur when she was seen in [place] approximately 1 year ago.  An echo in July 2019 suggestive of severe aortic valve stenosis with a mean gradient of 40 and peak instantaneous gradient of 67 mmHg.  At the time of my initial evaluation she was emphatic that she was completely asymptomatic and denied any change in pace of her activity.  She was cutting the grass regularly and denies PND orthopnea, presyncope or exertional dyspnea.  I had recommended very close follow-up in her most recent echo Doppler study, now 3 months later has shown significant progression such that her peak instantaneous gradient has increased from 51 to 89 mm and her mean gradient has increased from 40 mm to 67 mm.  I had scheduled her to undergo CT coronary angios to evaluate for coronary obstructive disease since I told her in July and often patients would become symptomatic sooner with concomitant CAD.  Her CT angios was reviewed with her in detail.  Essentially her coronary arteries are normal.  She does have mild ascending aortic root dilatation.  I have recommended consideration for aortic valve replacement surgery and feel she may be an excellent candidate for TAVR.  Since her CT  coronary angios did not reveal significant CAD and she is not having any significant dyspnea of symptomatology, I will refer her to Dr. Arvid Right for consideration of TAVR surgery.  Since her coronaries appear normal I will defer to him if he needs additional information regarding a right and left heart cardiac catheterization but perhaps this may not be necessary with her recent CT coronary angios as well as CT measurements.  I will arrange for surgical evaluation as soon as possible.  She has mild hyperlipidemia with total cholesterol 194 and LDL cholesterol 125.  I would recommend initiation of low-dose statin therapy have not started this presently and will institute during her subsequent evaluations.  She continues to be on low-dose levothyroxine at 50 mcg for hypothyroidism.  Medication Adjustments/Labs and Tests Ordered: Current medicines are reviewed at length with the patient today.  Concerns regarding medicines are outlined above.  Medication changes, Labs and Tests ordered today are listed in the Patient Instructions below. Patient Instructions  Medication Instructions:  Your physician recommends that you continue on your current medications as directed. Please refer to the Current Medication list given to you today.  If you need a refill on your cardiac medications before your next appointment, please call your pharmacy.   Follow-Up: Pending work up  Any Other Special Instructions Will Be Listed Below (If Applicable).  **You have been referred to Dr. Cyndia Bent       Signed, Shelva Majestic, MD  12/24/2017 6:07 PM    [facility] [address] [place], [place],   [address] Phone: [phone]

## 2017-12-24 NOTE — Patient Instructions (Addendum)
Medication Instructions:  Your physician recommends that you continue on your current medications as directed. Please refer to the Current Medication list given to you today.  If you need a refill on your cardiac medications before your next appointment, please call your pharmacy.   Follow-Up: Pending work up  Any Other Special Instructions Will Be Listed Below (If Applicable).  **You have been referred to Dr. Cyndia Bent

## 2017-12-30 ENCOUNTER — Institutional Professional Consult (permissible substitution) (INDEPENDENT_AMBULATORY_CARE_PROVIDER_SITE_OTHER): Payer: BC Managed Care – PPO | Admitting: Surgery

## 2017-12-30 ENCOUNTER — Other Ambulatory Visit: Payer: Self-pay

## 2017-12-30 ENCOUNTER — Encounter: Payer: Self-pay | Admitting: Surgery

## 2017-12-30 ENCOUNTER — Encounter: Payer: Self-pay | Admitting: *Deleted

## 2017-12-30 ENCOUNTER — Other Ambulatory Visit: Payer: Self-pay | Admitting: *Deleted

## 2017-12-30 VITALS — BP 138/84 | HR 74 | Resp 18 | Ht 66.0 in | Wt 219.0 lb

## 2017-12-30 DIAGNOSIS — I350 Nonrheumatic aortic (valve) stenosis: Secondary | ICD-10-CM | POA: Diagnosis not present

## 2017-12-30 NOTE — Progress Notes (Signed)
Cardiothoracic Surgery Consultation  PCP is Harlan Stains, MD Referring Provider is Troy Sine, MD  Chief Complaint  Patient presents with  . Aortic Stenosis    new patient consultation, ECHO 12/22/17, discuss surgery AVR vs TAVR    HPI:  The patient is a 62 year old woman with a history of multinodular goiter and hypothyroidism, dyslipidemia, and aortic stenosis that was diagnosed by an incidental heart murmur.  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  She saw Dr. Dema Severin and was noted to have a systolic murmur suspicious for significant aortic stenosis.  She said that she was asymptomatic at that time.  A 2D echocardiogram was done on 09/10/2017 which showed a mean gradient across aortic valve of 40 mmHg and a peak gradient of 67 mmHg.  Left ventricular function was normal with mild left ventricular hypertrophy.  There is mild aortic insufficiency.  She was referred to Dr. Claiborne Billings and underwent a gated cardiac CTA with calcium scoring on 10/24/2017.  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  Leaflets were severely thickened and calcified with restricted opening.  The aortic sinus diameter was 42 mm.  The sinotubular junction was 38 mm and the ascending aorta was 41 mm.  The aorta tapered back down in the aortic arch to 28 x 25 mm.  The aortic annular area was 697 mm with a perimeter of 98.1 mm.  Mean diameter was 29.8 mm.  Coronary artery anatomy appeared normal with no plaque in the left main, left circumflex, and right coronary arteries and minimal nonobstructive irregularities in the LAD.  She had a follow-up echocardiogram on 12/22/2017 which showed progression of the mean gradient across aortic valve to 51 mmHg with a peak of 89 mmHg.  The dimensionless index was 0.15 with a valve area of 0.88 cm.  Left ventricular ejection fraction  remained normal at 60 to 65% with grade 2 diastolic dysfunction.  The patient is here today with her husband.  She said that since she was diagnosed with worsening aortic stenosis she has stopped exercising.  He does report some exertional fatigue and shortness of breath with moderate activity.  She has had no dizziness or syncope.  She has had some chest tightness with exertion.  She has had no orthopnea or PND.  Past Medical History:  Diagnosis Date  . Adenomatous polyp   . Anemia   . Dyslipidemia   . Hypothyroidism   . Multinodular goiter   . Non morbid obesity due to excess calories   . Nontoxic goiter   . Severe aortic stenosis   . Varicose veins of both lower extremities     Past Surgical History:  Procedure Laterality Date  . COLONOSCOPY    . WISDOM TOOTH EXTRACTION      Family History  Problem Relation Age of Onset  . Lung cancer Mother   . Stroke Paternal Grandmother     Social History Social History   Tobacco Use  . Smoking status: Never Smoker  . Smokeless tobacco: Never Used  Substance Use Topics  . Alcohol use: Not on file  . Drug use: Not on file    Current Outpatient Medications  Medication Sig Dispense Refill  . levothyroxine (SYNTHROID, LEVOTHROID) 50 MCG tablet Take 1 tablet by mouth every morning.     No current facility-administered medications for this visit.     No  Known Allergies  Review of Systems  Constitutional: Positive for activity change and fatigue.  HENT: Negative.        Sees dentist regularly  Eyes: Negative.   Respiratory: Positive for chest tightness and shortness of breath.   Cardiovascular: Positive for palpitations. Negative for leg swelling.  Gastrointestinal: Negative.   Endocrine: Negative.   Genitourinary: Negative.   Musculoskeletal: Negative.   Skin: Negative.   Allergic/Immunologic: Negative.   Neurological: Negative for dizziness and syncope.  Hematological: Negative.   Psychiatric/Behavioral: The patient is  nervous/anxious.     BP 138/84 (BP Location: Right Arm, Patient Position: Sitting, Cuff Size: Normal)   Pulse 74   Resp 18   Ht 5\' 6"  (1.676 m)   Wt 219 lb (99.3 kg)   SpO2 99% Comment: RA  BMI 35.35 kg/m  Physical Exam  Constitutional: She is oriented to person, place, and time. She appears well-developed and well-nourished. No distress.  HENT:  Head: Normocephalic and atraumatic.  Mouth/Throat: Oropharynx is clear and moist.  Eyes: Conjunctivae and EOM are normal.  Neck: Normal range of motion. Neck supple. No JVD present.  Cardiovascular: Normal rate and regular rhythm.  Murmur heard. 3/6 systolic murmur along the right sternal border.  No diastolic murmur  Pulmonary/Chest: Effort normal and breath sounds normal. No respiratory distress.  Abdominal: Soft. She exhibits no distension.  Musculoskeletal: Normal range of motion. She exhibits no edema.  Lymphadenopathy:    She has no cervical adenopathy.  Neurological: She is alert and oriented to person, place, and time.  Skin: Skin is warm and dry.  Psychiatric: She has a normal mood and affect.     Diagnostic Tests:      [facility]                        [address], [place] [address]                            [phone]  ------------------------------------------------------------------- Echocardiography  Patient:    Dessire, Grimes MR #:       [id number] Study Date: 12/22/2017 Gender:     F Age:        61 Height:     167.6 cm Weight:     98.9 kg BSA:        2.19 m^2 Pt. Status: Room:   ORDERING     Shelva Majestic, M.D.  REFERRING    Shelva Majestic, M.D.  ATTENDING    Mertie Moores, M.D.  SONOGRAPHER  Cindy Hazy, RDCS  PERFORMING   [facility], Outpatient  cc:  ------------------------------------------------------------------- LV EF: 60% -   65%  ------------------------------------------------------------------- Indications:      I35.0 Aortic  Stenosis.  ------------------------------------------------------------------- History:   PMH:  Acquired from the patient and from the patient&'s chart.  PMH:  Hypothyroidism. Anemia.  Risk factors:  Dyslipidemia.   ------------------------------------------------------------------- Study Conclusions  - Left ventricle: The cavity size was normal. Wall thickness was   increased in a pattern of mild LVH. There was focal basal   hypertrophy. Systolic function was normal. The estimated ejection   fraction was in the range of 60% to 65%. Wall motion was normal;   there were no regional wall motion abnormalities. Features are   consistent with a pseudonormal  left ventricular filling pattern,   with concomitant abnormal relaxation and increased filling   pressure (grade 2 diastolic dysfunction). - Aortic valve: Mildly to moderately calcified annulus. Moderately   thickened, moderately calcified leaflets. There was severe   stenosis. There was mild to moderate regurgitation. - Mitral valve: There was mild regurgitation.  ------------------------------------------------------------------- Study data:   Study status:  Routine.  Procedure:  The patient reported no pain pre or post test. Transthoracic echocardiography for left ventricular function evaluation, for right ventricular function evaluation, and for assessment of valvular function. Image quality was adequate.  Study completion:  There were no complications.          Echocardiography.  M-mode, complete 2D, spectral Doppler, and color Doppler.  Birthdate:  Patient birthdate: 17-Dec-1955.  Age:  Patient is 62 yr old.  Sex:  Gender: female.    BMI: 35.2 kg/m^2.  Blood pressure:     127/89  Patient status:  Outpatient.  Study date:  Study date: 12/22/2017. Study time: 09:41 AM.  Location:  [facility]  -------------------------------------------------------------------  ------------------------------------------------------------------- Left ventricle:  Global longitudinal strain is -16.8%.  The cavity size was normal. Wall thickness was increased in a pattern of mild LVH. There was focal basal hypertrophy. Systolic function was normal. The estimated ejection fraction was in the range of 60% to 65%. Wall motion was normal; there were no regional wall motion abnormalities. Features are consistent with a pseudonormal left ventricular filling pattern, with concomitant abnormal relaxation and increased filling pressure (grade 2 diastolic dysfunction).  ------------------------------------------------------------------- Aortic valve:   Mildly to moderately calcified annulus. Moderately thickened, moderately calcified leaflets.  Doppler:   There was severe stenosis.   There was mild to moderate regurgitation.    VTI ratio of LVOT to aortic valve: 0.17. Valve area (VTI): 0.91 cm^2. Indexed valve area (VTI): 0.42 cm^2/m^2. Peak velocity ratio of LVOT to aortic valve: 0.15. Valve area (Vmax): 0.81 cm^2. Indexed valve area (Vmax): 0.37 cm^2/m^2. Mean velocity ratio of LVOT to aortic valve: 0.17. Valve area (Vmean): 0.88 cm^2. Indexed valve area (Vmean): 0.4 cm^2/m^2.    Mean gradient (S): 51 mm Hg. Peak gradient (S): 89 mm Hg.  ------------------------------------------------------------------- Aorta:  Aortic root: The aortic root was normal in size. Ascending aorta: The ascending aorta was mildly dilated.  ------------------------------------------------------------------- Mitral valve:   The valve appears to be grossly normal.    Doppler:  There was mild regurgitation.    Peak gradient (D): 4 mm Hg.  ------------------------------------------------------------------- Left atrium:  The atrium was at the upper limits of normal in size.    ------------------------------------------------------------------- Right ventricle:  The cavity size was normal. Systolic function was normal.  ------------------------------------------------------------------- Pulmonic valve:    The valve appears to be grossly normal. Doppler:  There was mild regurgitation.  ------------------------------------------------------------------- Tricuspid valve:   The valve appears to be grossly normal. Doppler:  There was trivial regurgitation.  ------------------------------------------------------------------- Right atrium:  The atrium was normal in size.  ------------------------------------------------------------------- Pericardium:  There was no pericardial effusion.  ------------------------------------------------------------------- Systemic veins: Inferior vena cava: The vessel was dilated. The respirophasic diameter changes were blunted (< 50%), consistent with elevated central venous pressure.  ------------------------------------------------------------------- Measurements   Left ventricle                           Value          Reference  LV ID, ED, PLAX chordal          (  L)     39    mm       43 - 52  LV ID, ES, PLAX chordal                  26    mm       23 - 38  LV fx shortening, PLAX chordal           33    %        >=29  LV PW thickness, ED                      11    mm       ----------  IVS/LV PW ratio, ED                      1.27           <=1.3  Stroke volume, 2D                        108   ml       ----------  Stroke volume/bsa, 2D                    49    ml/m^2   ----------  LV e&', lateral                           9.51  cm/s     ----------  LV E/e&', lateral                         10.83          ----------  LV e&', medial                            7.4   cm/s     ----------  LV E/e&', medial                          13.92          ----------  LV e&', average                           8.46  cm/s      ----------  LV E/e&', average                         12.18          ----------    Ventricular septum                       Value          Reference  IVS thickness, ED                        14    mm       ----------    LVOT                                     Value          Reference  LVOT ID, S  26    mm       ----------  LVOT area                                5.31  cm^2     ----------  LVOT ID                                  26    mm       ----------  LVOT peak velocity, S                    71.7  cm/s     ----------  LVOT mean velocity, S                    56    cm/s     ----------  LVOT VTI, S                              20.4  cm       ----------  Stroke volume (SV), LVOT DP              108.3 ml       ----------  Stroke index (SV/bsa), LVOT DP           49.5  ml/m^2   ----------    Aortic valve                             Value          Reference  Aortic valve peak velocity, S            472   cm/s     ----------  Aortic valve mean velocity, S            339   cm/s     ----------  Aortic valve VTI, S                      119   cm       ----------  Aortic mean gradient, S                  51    mm Hg    ----------  Aortic peak gradient, S                  89    mm Hg    ----------  VTI ratio, LVOT/AV                       0.17           ----------  Aortic valve area, VTI                   0.91  cm^2     ----------  Aortic valve area/bsa, VTI               0.42  cm^2/m^2 ----------  Velocity ratio, peak, LVOT/AV            0.15           ----------  Aortic valve area, peak velocity         0.81  cm^2     ----------  Aortic valve area/bsa, peak              0.37  cm^2/m^2 ----------  velocity  Velocity ratio, mean, LVOT/AV            0.17           ----------  Aortic valve area, mean velocity         0.88  cm^2     ----------  Aortic valve area/bsa, mean              0.4   cm^2/m^2 ----------  velocity  Aortic regurg pressure half-time         415    ms       ----------    Aorta                                    Value          Reference  Aortic root ID, ED                       41    mm       ----------  Ascending aorta ID, A-P, S               43    mm       ----------    Left atrium                              Value          Reference  LA ID, A-P, ES                           37    mm       ----------  LA ID/bsa, A-P                           1.69  cm/m^2   <=2.2  LA volume, S                             72.5  ml       ----------  LA volume/bsa, S                         33.2  ml/m^2   ----------  LA volume, ES, 1-p A4C                   66    ml       ----------  LA volume/bsa, ES, 1-p A4C               30.2  ml/m^2   ----------  LA volume, ES, 1-p A2C                   79.2  ml       ----------  LA volume/bsa, ES, 1-p A2C               36.2  ml/m^2   ----------    Mitral valve                             Value  Reference  Mitral E-wave peak velocity              103   cm/s     ----------  Mitral A-wave peak velocity              75.2  cm/s     ----------  Mitral deceleration time         (H)     236   ms       150 - 230  Mitral peak gradient, D                  4     mm Hg    ----------  Mitral E/A ratio, peak                   1.4            ----------    Tricuspid valve                          Value          Reference  Tricuspid regurg peak velocity           213   cm/s     ----------  Tricuspid peak RV-RA gradient            18    mm Hg    ----------    Right atrium                             Value          Reference  RA ID, S-I, ES, A4C                      46.8  mm       34 - 49  RA area, ES, A4C                         15.9  cm^2     8.3 - 19.5  RA volume, ES, A/L                       44.9  ml       ----------  RA volume/bsa, ES, A/L                   20.5  ml/m^2   ----------    Right ventricle                          Value          Reference  RV ID, minor axis, ED, A4C base          35    mm        ----------  TAPSE                                    25.8  mm       ----------  RV s&', lateral, S                        15    cm/s     ----------  Legend: (L)  and  (H)  mark values outside specified  reference range.  ------------------------------------------------------------------- Prepared and Electronically Authenticated by  Mertie Moores, M.D. 2019-10-22T14:02:01   ADDENDUM REPORT: 10/14/2017 15:33  CLINICAL DATA:  62 year old female with severe aortic stenosis being evaluated for AVR.  EXAM: Cardiac TAVR CT  TECHNIQUE: The patient was scanned on a [employer]. A 120 kV retrospective scan was triggered in the descending thoracic aorta at 111 HU's. Gantry rotation speed was 250 msecs and collimation was .6 mm. No beta blockade or nitro were given. The 3D data set was reconstructed in 5% intervals of the R-R cycle. Systolic and diastolic phases were analyzed on a dedicated work station using MPR, MIP and VRT modes. The patient received 80 cc of contrast.  FINDINGS: Aortic Valve: Functionally bicuspid aortic valve with co-joined left and right coronary leaflets. Leaflets are severely thickened and calcified with severely restricted leaflet opening in systole. Aortic valve is dilated at the sinus level with maximum diameter 42 mm.  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 Normal size of the aortic arch and descending aorta. Mild diffuse atheroma and calcifications. No dissection.  Sinotubular Junction: 38 x 36 mm  Ascending Thoracic Aorta: 41 x 41 mm  Aortic Arch: 28 x 25 mm  Descending Thoracic Aorta: 23 x 22 mm  Sinus of Valsalva Measurements:  Non-coronary: 41 mm  Right -coronary: 37 mm  Left -coronary:  Coronary Artery Height above Annulus:  Left Main: 14 mm  Right Coronary: 19 mm  Virtual Basal Annulus  Measurements:  Maximum/Minimum Diameter: 33.6 x 27.8 mm  Mean Diameter: 29.8 mm  Perimeter: 98.1 mm  Area: 697 mm2  Coronary Arteries: Normal coronary origin. Right dominance. Calcium score is 0.  RCA is a large artery that gives rise to PDA and PLA. There is no plaque.  Left main gives rise to LAD and LCX arteries and has no plaque.  LAD gives rise to one diagonal artery and has minimal non-obstructive irregularities.  LCX is a non-dominant artery that gives rise to one OM branch and has no plaque.  IMPRESSION: 1. 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 Leaflets are severely thickened and calcified with severely restricted leaflet opening in systole.  2. 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  3. Normal coronary origin. Right dominance. Calcium score is 0. No CAD.  4. No thrombus in the left atrial appendage.   Electronically Signed   By: Ena Dawley   On: 10/14/2017 15:33   Impression:  This 62 year old woman has a functionally bicuspid aortic valve and stage D, severe, symptomatic aortic stenosis with New York Heart Association class II symptoms of exertional fatigue and shortness of breath as well as some chest tightness consistent with chronic diastolic congestive heart failure.  I think she has probably had some development of symptoms over the past few months.  I have personally reviewed her 2D echocardiogram and gated cardiac CTA images.  I reviewed these with her and her husband.  Her echocardiogram shows a functionally bicuspid aortic valve that is heavily calcified with poor leaflet mobility and a mean gradient of 51 mmHg consistent with severe aortic stenosis.  Left ventricular function is preserved.  Her gated cardiac CTA shows an annular area of 697 mm which is too large for a transcatheter aortic valve  replacement.  In addition functionally bicuspid aortic valves are not  ideal for transcatheter aortic valve replacement.  I would be hesitant to recommend a transcatheter aortic valve even if her annular size was adequate since she is only 62 years old and we do not know with the longevity of these valves will be.  Discussed all this with her and her husband and they seem to understand and are in agreement with proceeding with open surgical aortic valve replacement.  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  I recommended just replacing her aortic valve.  I discussed the options of bioprosthetic versus mechanical prostheses.  She is not interested in being on Coumadin and would like to use a bioprosthetic pericardial valve.  Think that is a reasonable alternative given her age.  The current [employer]ILIA pericardial valve is expected to have excellent longevity and she will have a large valve which should improve longevity and allow valve in valve transcatheter aortic valve replacement in the future if her valve does have significant structural deterioration in her lifetime.  This will obviate the need for lifetime anticoagulation with Coumadin and the 1 to 2 %/year risk of thromboembolism and anticoagulation related bleeding.  I discussed the operative procedure with the patient and her husband including alternatives, benefits and risks; including but not limited to bleeding, blood transfusion, infection, stroke, myocardial infarction, graft failure, heart block requiring a permanent pacemaker, organ dysfunction, and death.  Keandrea [id number] understands and agrees to proceed.   I told her that she would need to be out of work for 3 months postoperatively with a restriction of no lifting more than 10 pounds and no pushing or pulling during that 59-month period.  Plan:  She will be  scheduled for aortic valve replacement using a bioprosthetic valve on Monday, December 11, 2017.  I spent 60 minutes performing this consultation and > 50% of this time was spent face to face counseling and coordinating the care of this patient's severe symptomatic aortic stenosis.   Gaye Pollack, MD [facility] [phone]

## 2018-01-06 NOTE — Pre-Procedure Instructions (Signed)
Puja Caffey [facility]  01/06/2018      [facility] [address], [place] [address] [place] [address] Phone: [phone] Fax: [phone]    Your procedure is scheduled on Nov 11th.  Report to [facility] Admitting at 5:30 A.M.  Call this number if you have problems the morning of surgery:  [phone]   Remember:  Do not eat or drink after midnight.     Take these medicines the morning of surgery with A SIP OF WATER   Levothyroxine (Synthroid)  7 days prior to surgery STOP taking any Aspirin(unless otherwise instructed by your surgeon), Aleve, Naproxen, Ibuprofen, Motrin, Advil, Goody's, BC's, all herbal medications, fish oil, and all vitamins    Do not wear jewelry, make-up or nail polish.  Do not wear lotions, powders, or perfumes, or deodorant.  Do not shave 48 hours prior to surgery.  Men may shave face and neck.  Do not bring valuables to the hospital.  [facility] is not responsible for any belongings or valuables.   [place]- Preparing For Surgery  Before surgery, you can play an important role. Because skin is not sterile, your skin needs to be as free of germs as possible. You can reduce the number of germs on your skin by washing with CHG (chlorahexidine gluconate) Soap before surgery.  CHG is an antiseptic cleaner which kills germs and bonds with the skin to continue killing germs even after washing.    Oral Hygiene is also important to reduce your risk of infection.  Remember - BRUSH YOUR TEETH THE MORNING OF SURGERY WITH YOUR REGULAR TOOTHPASTE  Please do not use if you have an allergy to CHG or antibacterial soaps. If your skin becomes reddened/irritated stop using the CHG.  Do not shave (including legs and underarms) for at least 48 hours prior to first CHG shower. It is OK to shave your face.  Please follow these instructions carefully.   1. Shower the NIGHT BEFORE SURGERY and the MORNING OF SURGERY with  CHG.   2. If you chose to wash your hair, wash your hair first as usual with your normal shampoo.  3. After you shampoo, rinse your hair and body thoroughly to remove the shampoo.  4. Use CHG as you would any other liquid soap. You can apply CHG directly to the skin and wash gently with a scrungie or a clean washcloth.   5. Apply the CHG Soap to your body ONLY FROM THE NECK DOWN.  Do not use on open wounds or open sores. Avoid contact with your eyes, ears, mouth and genitals (private parts). Wash Face and genitals (private parts)  with your normal soap.  6. Wash thoroughly, paying special attention to the area where your surgery will be performed.  7. Thoroughly rinse your body with warm water from the neck down.  8. DO NOT shower/wash with your normal soap after using and rinsing off the CHG Soap.  9. Pat yourself dry with a CLEAN TOWEL.  10. Wear CLEAN PAJAMAS to bed the night before surgery, wear comfortable clothes the morning of surgery  11. Place CLEAN SHEETS on your bed the night of your first shower and DO NOT SLEEP WITH PETS.   Day of Surgery:  Do not apply any deodorants/lotions.  Please wear clean clothes to the hospital/surgery center.   Remember to brush your teeth WITH YOUR REGULAR TOOTHPASTE.   Contacts, dentures or bridgework may not be  worn into surgery.  Leave your suitcase in the car.  After surgery it may be brought to your room.  For patients admitted to the hospital, discharge time will be determined by your treatment team.  Patients discharged the day of surgery will not be allowed to drive home.   Please read over the following fact sheets that you were given. Coughing and Deep Breathing, MRSA Information and Surgical Site Infection Prevention

## 2018-01-07 ENCOUNTER — Ambulatory Visit (HOSPITAL_BASED_OUTPATIENT_CLINIC_OR_DEPARTMENT_OTHER)
Admission: RE | Admit: 2018-01-07 | Discharge: 2018-01-07 | Disposition: A | Discharge status: Home / Self Care | Payer: BC Managed Care – PPO | Source: Ambulatory Visit | Attending: Surgery | Admitting: Surgery

## 2018-01-07 ENCOUNTER — Encounter (HOSPITAL_COMMUNITY)
Admission: RE | Admit: 2018-01-07 | Discharge: 2018-01-07 | Disposition: A | Discharge status: Home / Self Care | Payer: BC Managed Care – PPO | Source: Ambulatory Visit | Attending: Surgery | Admitting: Surgery

## 2018-01-07 ENCOUNTER — Encounter (HOSPITAL_COMMUNITY): Payer: Self-pay

## 2018-01-07 ENCOUNTER — Ambulatory Visit (HOSPITAL_COMMUNITY)
Admission: RE | Admit: 2018-01-07 | Discharge: 2018-01-07 | Disposition: A | Discharge status: Home / Self Care | Payer: BC Managed Care – PPO | Source: Ambulatory Visit | Attending: Surgery | Admitting: Surgery

## 2018-01-07 ENCOUNTER — Other Ambulatory Visit: Payer: Self-pay

## 2018-01-07 DIAGNOSIS — I350 Nonrheumatic aortic (valve) stenosis: Secondary | ICD-10-CM

## 2018-01-07 HISTORY — DX: Pneumonia, unspecified organism: J18.9

## 2018-01-07 HISTORY — DX: Unspecified osteoarthritis, unspecified site: M19.90

## 2018-01-07 LAB — CBC
HCT: 39.3 % (ref 36.0–46.0)
Hemoglobin: 12.6 g/dL (ref 12.0–15.0)
MCH: 28.9 pg (ref 26.0–34.0)
MCHC: 32.1 g/dL (ref 30.0–36.0)
MCV: 90.1 fL (ref 80.0–100.0)
NRBC: 0 % (ref 0.0–0.2)
PLATELETS: 287 10*3/uL (ref 150–400)
RBC: 4.36 MIL/uL (ref 3.87–5.11)
RDW: 13.1 % (ref 11.5–15.5)
WBC: 7.9 10*3/uL (ref 4.0–10.5)

## 2018-01-07 LAB — URINALYSIS, ROUTINE W REFLEX MICROSCOPIC
Bilirubin Urine: NEGATIVE
GLUCOSE, UA: NEGATIVE mg/dL
KETONES UR: NEGATIVE mg/dL
Nitrite: NEGATIVE
PROTEIN: NEGATIVE mg/dL
Specific Gravity, Urine: 1.024 (ref 1.005–1.030)
pH: 5 (ref 5.0–8.0)

## 2018-01-07 LAB — BLOOD GAS, ARTERIAL
Acid-Base Excess: 0.2 mmol/L (ref 0.0–2.0)
Bicarbonate: 23.8 mmol/L (ref 20.0–28.0)
DRAWN BY: 421801
FIO2: 21
O2 SAT: 97.6 %
PATIENT TEMPERATURE: 98.6
PO2 ART: 93.5 mmHg (ref 83.0–108.0)
pCO2 arterial: 35.7 mmHg (ref 32.0–48.0)
pH, Arterial: 7.44 (ref 7.350–7.450)

## 2018-01-07 LAB — PULMONARY FUNCTION TEST
DL/VA % pred: 87 %
DL/VA: 4.41 ml/min/mmHg/L
DLCO UNC: 24.77 ml/min/mmHg
DLCO unc % pred: 91 %
FEF 25-75 PRE: 3.66 L/s
FEF 25-75 Post: 3.78 L/sec
FEF2575-%Change-Post: 3 %
FEF2575-%Pred-Post: 153 %
FEF2575-%Pred-Pre: 148 %
FEV1-%CHANGE-POST: 0 %
FEV1-%PRED-POST: 115 %
FEV1-%PRED-PRE: 115 %
FEV1-POST: 3.17 L
FEV1-Pre: 3.17 L
FEV1FVC-%Change-Post: 3 %
FEV1FVC-%Pred-Pre: 106 %
FEV6-%CHANGE-POST: -3 %
FEV6-%PRED-POST: 107 %
FEV6-%PRED-PRE: 111 %
FEV6-POST: 3.68 L
FEV6-PRE: 3.82 L
FEV6FVC-%CHANGE-POST: 0 %
FEV6FVC-%PRED-PRE: 103 %
FEV6FVC-%Pred-Post: 103 %
FVC-%CHANGE-POST: -3 %
FVC-%Pred-Post: 103 %
FVC-%Pred-Pre: 107 %
FVC-Post: 3.69 L
FVC-Pre: 3.82 L
POST FEV6/FVC RATIO: 100 %
PRE FEV6/FVC RATIO: 100 %
Post FEV1/FVC ratio: 86 %
Pre FEV1/FVC ratio: 83 %
RV % PRED: 123 %
RV: 2.59 L
TLC % pred: 117 %
TLC: 6.28 L

## 2018-01-07 LAB — COMPREHENSIVE METABOLIC PANEL
ALBUMIN: 3.9 g/dL (ref 3.5–5.0)
ALT: 11 U/L (ref 0–44)
AST: 17 U/L (ref 15–41)
Alkaline Phosphatase: 61 U/L (ref 38–126)
Anion gap: 7 (ref 5–15)
BUN: 21 mg/dL — AB (ref 6–20)
CHLORIDE: 107 mmol/L (ref 98–111)
CO2: 24 mmol/L (ref 22–32)
CREATININE: 0.87 mg/dL (ref 0.44–1.00)
Calcium: 9.3 mg/dL (ref 8.9–10.3)
GFR calc Af Amer: >60 mL/min (ref >60)
GFR calc non Af Amer: >60 mL/min (ref >60)
Glucose, Bld: 118 mg/dL — ABNORMAL HIGH (ref 70–99)
POTASSIUM: 3.8 mmol/L (ref 3.5–5.1)
SODIUM: 138 mmol/L (ref 135–145)
Total Bilirubin: 0.8 mg/dL (ref 0.3–1.2)
Total Protein: 7.1 g/dL (ref 6.5–8.1)

## 2018-01-07 LAB — HEMOGLOBIN A1C
HEMOGLOBIN A1C: 5.1 % (ref 4.8–5.6)
Mean Plasma Glucose: 99.67 mg/dL

## 2018-01-07 LAB — PROTIME-INR
INR: 1.03
Prothrombin Time: 13.4 seconds (ref 11.4–15.2)

## 2018-01-07 LAB — APTT: APTT: 33 s (ref 24–36)

## 2018-01-07 LAB — TYPE AND SCREEN
ABO/RH(D): A NEG
ANTIBODY SCREEN: NEGATIVE

## 2018-01-07 LAB — SURGICAL PCR SCREEN
MRSA, PCR: NEGATIVE
STAPHYLOCOCCUS AUREUS: POSITIVE — AB

## 2018-01-07 LAB — ABO/RH: ABO/RH(D): A NEG

## 2018-01-07 MED ORDER — ALBUTEROL SULFATE (2.5 MG/3ML) 0.083% IN NEBU
2.5000 mg | INHALATION_SOLUTION | Freq: Once | RESPIRATORY_TRACT | Status: AC
  Administered 2018-01-07: 2.5 mg via RESPIRATORY_TRACT

## 2018-01-07 NOTE — Progress Notes (Signed)
Mupirocin Ointment Rx called [facility] [address] for positive PCR of Staph. Pt notified of results and need to pick up Rx

## 2018-01-07 NOTE — Progress Notes (Signed)
PCP -  Harlan Stains MD Cardiologist - Shelva Majestic MD  Chest x-ray - 01/07/18 EKG - 12/25/17 epic ECHO - 12/2017  Blood Thinner Instructions: N/A Aspirin Instructions:N/A  Anesthesia review:  Yes, EKG review  Patient denies shortness of breath, fever, cough and chest pain at PAT appointment   Patient verbalized understanding of instructions that were given to them at the PAT appointment. Patient was also instructed that they will need to review over the PAT instructions again at home before surgery.

## 2018-01-07 NOTE — Progress Notes (Signed)
Pre cardiac surgery evaluation   Right Carotid:The extracranial vessels were near-normal with only minimal wall thickening or plaque.  Left Carotid: The extracranial vessels were near-normal with only minimal wall thickening or plaque.    Right Upper Extremity: Doppler waveforms decrease 50% with right radial compression. Doppler waveforms remain within normal limits with right ulnar compression.  Left Upper Extremity: Doppler waveforms remain within normal limits with left radial compression. Doppler waveforms remain within normal limits with left ulnar compression.    Landry Mellow, RDMS, RVT

## 2018-01-10 MED ORDER — NITROGLYCERIN IN D5W 200-5 MCG/ML-% IV SOLN
2.0000 ug/min | INTRAVENOUS | Status: DC
  Filled 2018-01-10: qty 250

## 2018-01-10 MED ORDER — TRANEXAMIC ACID (OHS) PUMP PRIME SOLUTION
2.0000 mg/kg | INTRAVENOUS | Status: DC
  Filled 2018-01-10: qty 1.97

## 2018-01-10 MED ORDER — METOPROLOL TARTRATE 12.5 MG HALF TABLET
12.5000 mg | ORAL_TABLET | Freq: Once | ORAL | Status: AC
  Administered 2018-01-11: 12.5 mg via ORAL
  Filled 2018-01-10: qty 1

## 2018-01-10 MED ORDER — EPINEPHRINE PF 1 MG/ML IJ SOLN
0.0000 ug/min | INTRAVENOUS | Status: DC
  Filled 2018-01-10: qty 4

## 2018-01-10 MED ORDER — DEXMEDETOMIDINE HCL IN NACL 400 MCG/100ML IV SOLN
0.1000 ug/kg/h | INTRAVENOUS | Status: AC
  Administered 2018-01-11: 0.7 ug/kg/h via INTRAVENOUS
  Filled 2018-01-10: qty 100

## 2018-01-10 MED ORDER — POTASSIUM CHLORIDE 2 MEQ/ML IV SOLN
80.0000 meq | INTRAVENOUS | Status: DC
  Filled 2018-01-10: qty 40

## 2018-01-10 MED ORDER — VANCOMYCIN HCL 10 G IV SOLR
1500.0000 mg | INTRAVENOUS | Status: AC
  Administered 2018-01-11: 1500 mg via INTRAVENOUS
  Filled 2018-01-10: qty 1500

## 2018-01-10 MED ORDER — TRANEXAMIC ACID 1000 MG/10ML IV SOLN
1.5000 mg/kg/h | INTRAVENOUS | Status: AC
  Administered 2018-01-11: 1.5 mg/kg/h via INTRAVENOUS
  Filled 2018-01-10: qty 25

## 2018-01-10 MED ORDER — CHLORHEXIDINE GLUCONATE 0.12 % MT SOLN
15.0000 mL | Freq: Once | OROMUCOSAL | Status: AC
  Administered 2018-01-11: 15 mL via OROMUCOSAL
  Filled 2018-01-10: qty 15

## 2018-01-10 MED ORDER — PHENYLEPHRINE HCL-NACL 20-0.9 MG/250ML-% IV SOLN
30.0000 ug/min | INTRAVENOUS | Status: DC
  Filled 2018-01-10: qty 250

## 2018-01-10 MED ORDER — SODIUM CHLORIDE 0.9 % IV SOLN
INTRAVENOUS | Status: DC
  Filled 2018-01-10: qty 30

## 2018-01-10 MED ORDER — MILRINONE LACTATE IN DEXTROSE 20-5 MG/100ML-% IV SOLN
0.3000 ug/kg/min | INTRAVENOUS | Status: DC
  Filled 2018-01-10: qty 100

## 2018-01-10 MED ORDER — INSULIN REGULAR(HUMAN) IN NACL 100-0.9 UT/100ML-% IV SOLN
INTRAVENOUS | Status: AC
  Administered 2018-01-11: .9 [IU]/h via INTRAVENOUS
  Filled 2018-01-10: qty 100

## 2018-01-10 MED ORDER — DOPAMINE-DEXTROSE 3.2-5 MG/ML-% IV SOLN
0.0000 ug/kg/min | INTRAVENOUS | Status: DC
  Filled 2018-01-10: qty 250

## 2018-01-10 MED ORDER — SODIUM CHLORIDE 0.9 % IV SOLN
1.5000 g | INTRAVENOUS | Status: AC
  Administered 2018-01-11: 1.5 g via INTRAVENOUS
  Filled 2018-01-10: qty 1.5

## 2018-01-10 MED ORDER — TRANEXAMIC ACID (OHS) BOLUS VIA INFUSION
15.0000 mg/kg | INTRAVENOUS | Status: AC
  Administered 2018-01-11: 1477.5 mg via INTRAVENOUS
  Filled 2018-01-10 (×2): qty 1478

## 2018-01-10 MED ORDER — MAGNESIUM SULFATE 50 % IJ SOLN
40.0000 meq | INTRAMUSCULAR | Status: DC
  Filled 2018-01-10: qty 9.85

## 2018-01-10 MED ORDER — SODIUM CHLORIDE 0.9 % IV SOLN
750.0000 mg | INTRAVENOUS | Status: DC
  Filled 2018-01-10: qty 750

## 2018-01-10 MED ORDER — PLASMA-LYTE 148 IV SOLN
INTRAVENOUS | Status: DC
  Filled 2018-01-10: qty 2.5

## 2018-01-10 NOTE — H&P (Signed)
[address]       [place] [address]             [phone]      Cardiothoracic Surgery Admission History and Physical   PCP is Harlan Stains, MD Referring Provider is Troy Sine, MD      Chief Complaint  Patient presents with  . Aortic Stenosis        HPI:  The patient is a 62 year old woman with a history of multinodular goiter and hypothyroidism, dyslipidemia, and aortic stenosis that was diagnosed by an incidental heart murmur.  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  She saw Dr. Dema Severin and was noted to have a systolic murmur suspicious for significant aortic stenosis.  She said that she was asymptomatic at that time.  A 2D echocardiogram was done on 09/10/2017 which showed a mean gradient across aortic valve of 40 mmHg and a peak gradient of 67 mmHg.  Left ventricular function was normal with mild left ventricular hypertrophy.  There is mild aortic insufficiency.  She was referred to Dr. Claiborne Billings and underwent a gated cardiac CTA with calcium scoring on 10/24/2017.  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  Leaflets were severely thickened and calcified with restricted opening.  The aortic sinus diameter was 42 mm.  The sinotubular junction was 38 mm and the ascending aorta was 41 mm.  The aorta tapered back down in the aortic arch to 28 x 25 mm.  The aortic annular area was 697 mm with a perimeter of 98.1 mm.  Mean diameter was 29.8 mm.  Coronary artery anatomy appeared normal with no plaque in the left main, left circumflex, and right coronary arteries and minimal nonobstructive irregularities in the LAD.  She had a follow-up echocardiogram on 12/22/2017 which showed progression of the mean gradient across aortic valve to 51 mmHg with a peak of 89 mmHg.  The dimensionless index was 0.15 with a  valve area of 0.88 cm.  Left ventricular ejection fraction remained normal at 60 to 65% with grade 2 diastolic dysfunction.  The patient is here today with her husband.  She said that since she was diagnosed with worsening aortic stenosis she has stopped exercising.  He does report some exertional fatigue and shortness of breath with moderate activity.  She has had no dizziness or syncope.  She has had some chest tightness with exertion.  She has had no orthopnea or PND.      Past Medical History:  Diagnosis Date  . Adenomatous polyp   . Anemia   . Dyslipidemia   . Hypothyroidism   . Multinodular goiter   . Non morbid obesity due to excess calories   . Nontoxic goiter   . Severe aortic stenosis   . Varicose veins of both lower extremities          Past Surgical History:  Procedure Laterality Date  . COLONOSCOPY    . WISDOM TOOTH EXTRACTION           Family History  Problem Relation Age of Onset  . Lung cancer Mother   . Stroke Paternal Grandmother     Social History Social History       Tobacco Use  . Smoking status: Never Smoker  . Smokeless tobacco: Never Used  Substance Use Topics  . Alcohol use: Not  on file  . Drug use: Not on file          Current Outpatient Medications  Medication Sig Dispense Refill  . levothyroxine (SYNTHROID, LEVOTHROID) 50 MCG tablet Take 1 tablet by mouth every morning.     No current facility-administered medications for this visit.     No Known Allergies  Review of Systems  Constitutional: Positive for activity change and fatigue.  HENT: Negative.        Sees dentist regularly  Eyes: Negative.   Respiratory: Positive for chest tightness and shortness of breath.   Cardiovascular: Positive for palpitations. Negative for leg swelling.  Gastrointestinal: Negative.   Endocrine: Negative.   Genitourinary: Negative.   Musculoskeletal: Negative.   Skin: Negative.   Allergic/Immunologic: Negative.    Neurological: Negative for dizziness and syncope.  Hematological: Negative.   Psychiatric/Behavioral: The patient is nervous/anxious.     BP 138/84 (BP Location: Right Arm, Patient Position: Sitting, Cuff Size: Normal)   Pulse 74   Resp 18   Ht 5\' 6"  (1.676 m)   Wt 219 lb (99.3 kg)   SpO2 99% Comment: RA  BMI 35.35 kg/m  Physical Exam  Constitutional: She is oriented to person, place, and time. She appears well-developed and well-nourished. No distress.  HENT:  Head: Normocephalic and atraumatic.  Mouth/Throat: Oropharynx is clear and moist.  Eyes: Conjunctivae and EOM are normal.  Neck: Normal range of motion. Neck supple. No JVD present.  Cardiovascular: Normal rate and regular rhythm.  Murmur heard. 3/6 systolic murmur along the right sternal border.  No diastolic murmur  Pulmonary/Chest: Effort normal and breath sounds normal. No respiratory distress.  Abdominal: Soft. She exhibits no distension.  Musculoskeletal: Normal range of motion. She exhibits no edema.  Lymphadenopathy:    She has no cervical adenopathy.  Neurological: She is alert and oriented to person, place, and time.  Skin: Skin is warm and dry.  Psychiatric: She has a normal mood and affect.     Diagnostic Tests:  [facility] [address], [place] [address] [phone]  ------------------------------------------------------------------- Echocardiography  Patient: Layna, Roeper MR #: [id number] Study Date: 12/22/2017 Gender: F Age: 62 Height: 167.6 cm Weight: 98.9 kg BSA: 2.19 m^2 Pt. Status: Room:  ORDERING Shelva Majestic, M.D. REFERRING Shelva Majestic, M.D. ATTENDING Mertie Moores, M.D. SONOGRAPHER Cindy Hazy, RDCS PERFORMING [facility], Outpatient  cc:  ------------------------------------------------------------------- LV  EF: 60% - 65%  ------------------------------------------------------------------- Indications: I35.0 Aortic Stenosis.  ------------------------------------------------------------------- History: PMH: Acquired from the patient and from the patient&'s chart. PMH: Hypothyroidism. Anemia. Risk factors: Dyslipidemia.  ------------------------------------------------------------------- Study Conclusions  - Left ventricle: The cavity size was normal. Wall thickness was increased in a pattern of mild LVH. There was focal basal hypertrophy. Systolic function was normal. The estimated ejection fraction was in the range of 60% to 65%. Wall motion was normal; there were no regional wall motion abnormalities. Features are consistent with a pseudonormal left ventricular filling pattern, with concomitant abnormal relaxation and increased filling pressure (grade 2 diastolic dysfunction). - Aortic valve: Mildly to moderately calcified annulus. Moderately thickened, moderately calcified leaflets. There was severe stenosis. There was mild to moderate regurgitation. - Mitral valve: There was mild regurgitation.  ------------------------------------------------------------------- Study data: Study status: Routine. Procedure: The patient reported no pain pre or post test. Transthoracic echocardiography for left ventricular function evaluation, for right ventricular function evaluation, and for assessment of valvular function. Image quality was adequate. Study completion: There were no complications. Echocardiography. M-mode, complete 2D, spectral Doppler, and  color Doppler. Birthdate: Patient birthdate: 07-14-1955. Age: Patient is 62 yr old. Sex: Gender: female. BMI: 35.2 kg/m^2. Blood pressure: 127/89 Patient status: Outpatient. Study date: Study date: 12/22/2017. Study time: 09:41 AM. Location:  Site  3  -------------------------------------------------------------------  ------------------------------------------------------------------- Left ventricle: Global longitudinal strain is -16.8%.  The cavity size was normal. Wall thickness was increased in a pattern of mild LVH. There was focal basal hypertrophy. Systolic function was normal. The estimated ejection fraction was in the range of 60% to 65%. Wall motion was normal; there were no regional wall motion abnormalities. Features are consistent with a pseudonormal left ventricular filling pattern, with concomitant abnormal relaxation and increased filling pressure (grade 2 diastolic dysfunction).  ------------------------------------------------------------------- Aortic valve: Mildly to moderately calcified annulus. Moderately thickened, moderately calcified leaflets. Doppler: There was severe stenosis. There was mild to moderate regurgitation. VTI ratio of LVOT to aortic valve: 0.17. Valve area (VTI): 0.91 cm^2. Indexed valve area (VTI): 0.42 cm^2/m^2. Peak velocity ratio of LVOT to aortic valve: 0.15. Valve area (Vmax): 0.81 cm^2. Indexed valve area (Vmax): 0.37 cm^2/m^2. Mean velocity ratio of LVOT to aortic valve: 0.17. Valve area (Vmean): 0.88 cm^2. Indexed valve area (Vmean): 0.4 cm^2/m^2. Mean gradient (S): 51 mm Hg. Peak gradient (S): 89 mm Hg.  ------------------------------------------------------------------- Aorta: Aortic root: The aortic root was normal in size. Ascending aorta: The ascending aorta was mildly dilated.  ------------------------------------------------------------------- Mitral valve: The valve appears to be grossly normal. Doppler: There was mild regurgitation. Peak gradient (D): 4 mm Hg.  ------------------------------------------------------------------- Left atrium: The atrium was at the upper limits of normal in  size.  ------------------------------------------------------------------- Right ventricle: The cavity size was normal. Systolic function was normal.  ------------------------------------------------------------------- Pulmonic valve: The valve appears to be grossly normal. Doppler: There was mild regurgitation.  ------------------------------------------------------------------- Tricuspid valve: The valve appears to be grossly normal. Doppler: There was trivial regurgitation.  ------------------------------------------------------------------- Right atrium: The atrium was normal in size.  ------------------------------------------------------------------- Pericardium: There was no pericardial effusion.  ------------------------------------------------------------------- Systemic veins: Inferior vena cava: The vessel was dilated. The respirophasic diameter changes were blunted (< 50%), consistent with elevated central venous pressure.  ------------------------------------------------------------------- Measurements  Left ventricle Value Reference LV ID, ED, PLAX chordal (L) 39 mm 43 - 52 LV ID, ES, PLAX chordal 26 mm 23 - 38 LV fx shortening, PLAX chordal 33 % >=29 LV PW thickness, ED 11 mm ---------- IVS/LV PW ratio, ED 1.27 <=1.3 Stroke volume, 2D 108 ml ---------- Stroke volume/bsa, 2D 49 ml/m^2 ---------- LV e&', lateral 9.51 cm/s ---------- LV E/e&', lateral 10.83 ---------- LV e&', medial 7.4 cm/s ---------- LV E/e&', medial 13.92 ---------- LV e&', average 8.46 cm/s  ---------- LV E/e&', average 12.18 ----------  Ventricular septum Value Reference IVS thickness, ED 14 mm ----------  LVOT Value Reference LVOT ID, S 26 mm ---------- LVOT area 5.31 cm^2 ---------- LVOT ID 26 mm ---------- LVOT peak velocity, S 71.7 cm/s ---------- LVOT mean velocity, S 56 cm/s ---------- LVOT VTI, S 20.4 cm ---------- Stroke volume (SV), LVOT DP 108.3 ml ---------- Stroke index (SV/bsa), LVOT DP 49.5 ml/m^2 ----------  Aortic valve Value Reference Aortic valve peak velocity, S 472 cm/s ---------- Aortic valve mean velocity, S 339 cm/s ---------- Aortic valve VTI, S 119 cm ---------- Aortic mean gradient, S 51 mm Hg ---------- Aortic peak gradient, S 89 mm Hg ---------- VTI ratio, LVOT/AV 0.17 ---------- Aortic valve area, VTI 0.91 cm^2 ---------- Aortic valve area/bsa, VTI 0.42 cm^2/m^2 ---------- Velocity ratio, peak, LVOT/AV 0.15 ---------- Aortic valve area, peak velocity 0.81  cm^2 ---------- Aortic valve area/bsa, peak 0.37 cm^2/m^2 ---------- velocity Velocity ratio, mean, LVOT/AV 0.17 ---------- Aortic valve area, mean velocity 0.88 cm^2 ---------- Aortic valve area/bsa, mean 0.4 cm^2/m^2 ---------- velocity Aortic regurg pressure half-time  415 ms ----------  Aorta Value Reference Aortic root ID, ED 41 mm ---------- Ascending aorta ID, A-P, S 43 mm ----------  Left atrium Value Reference LA ID, A-P, ES 37 mm ---------- LA ID/bsa, A-P 1.69 cm/m^2 <=2.2 LA volume, S 72.5 ml ---------- LA volume/bsa, S 33.2 ml/m^2 ---------- LA volume, ES, 1-p A4C 66 ml ---------- LA volume/bsa, ES, 1-p A4C 30.2 ml/m^2 ---------- LA volume, ES, 1-p A2C 79.2 ml ---------- LA volume/bsa, ES, 1-p A2C 36.2 ml/m^2 ----------  Mitral valve Value Reference Mitral E-wave peak velocity 103 cm/s ---------- Mitral A-wave peak velocity 75.2 cm/s ---------- Mitral deceleration time (H) 236 ms 150 - 230 Mitral peak gradient, D 4 mm Hg ---------- Mitral E/A ratio, peak 1.4 ----------  Tricuspid valve Value Reference Tricuspid regurg peak velocity 213 cm/s ---------- Tricuspid peak RV-RA gradient 18 mm Hg ----------  Right atrium Value Reference RA ID, S-I, ES, A4C 46.8 mm 34 - 49 RA area, ES, A4C 15.9 cm^2 8.3 - 19.5 RA volume, ES, A/L 44.9 ml ---------- RA volume/bsa, ES, A/L 20.5 ml/m^2 ----------  Right ventricle Value Reference RV ID, minor axis, ED, A4C base 35  mm ---------- TAPSE 25.8 mm ---------- RV s&', lateral, S 15 cm/s ----------  Legend: (L) and (H) mark values outside specified reference range.  ------------------------------------------------------------------- Prepared and Electronically Authenticated by  Mertie Moores, M.D. 2019-10-22T14:02:01   ADDENDUM REPORT: 10/14/2017 15:33  CLINICAL DATA: 62 year old female with severe aortic stenosis being evaluated for AVR.  EXAM: Cardiac TAVR CT  TECHNIQUE: The patient was scanned on a [employer]. A 120 kV retrospective scan was triggered in the descending thoracic aorta at 111 HU's. Gantry rotation speed was 250 msecs and collimation was .6 mm. No beta blockade or nitro were given. The 3D data set was reconstructed in 5% intervals of the R-R cycle. Systolic and diastolic phases were analyzed on a dedicated work station using MPR, MIP and VRT modes. The patient received 80 cc of contrast.  FINDINGS: Aortic Valve: Functionally bicuspid aortic valve with co-joined left and right coronary leaflets. Leaflets are severely thickened and calcified with severely restricted leaflet opening in systole. Aortic valve is dilated at the sinus level with maximum diameter 42 mm.  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 Normal size of the aortic arch and descending aorta. Mild diffuse atheroma and calcifications. No dissection.  Sinotubular Junction: 38 x 36 mm  Ascending Thoracic Aorta: 41 x 41 mm  Aortic Arch: 28 x 25 mm  Descending Thoracic Aorta: 23 x 22 mm  Sinus of Valsalva Measurements:  Non-coronary: 41 mm  Right -coronary: 37 mm  Left -coronary:  Coronary Artery Height above Annulus:  Left Main: 14 mm  Right Coronary: 19 mm  Virtual Basal Annulus  Measurements:  Maximum/Minimum Diameter: 33.6 x 27.8 mm  Mean Diameter: 29.8 mm  Perimeter: 98.1 mm  Area: 697 mm2  Coronary Arteries: Normal coronary origin. Right dominance. Calcium score is 0.  RCA is a large artery that gives rise to PDA and PLA. There is no plaque.  Left main gives rise to LAD and LCX arteries and has no plaque.  LAD gives rise to one diagonal artery and has minimal non-obstructive irregularities.  LCX is a non-dominant artery that gives rise to one OM branch and has no plaque.  IMPRESSION: 1. 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 Leaflets are severely thickened and calcified with severely restricted leaflet opening in systole.  2. 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  3. Normal coronary origin. Right dominance. Calcium score is 0. No CAD.  4. No thrombus in the left atrial appendage.   Electronically Signed By: Ena Dawley On: 10/14/2017 15:33   Impression:  This 62 year old woman has a functionally bicuspid aortic valve and stage D, severe, symptomatic aortic stenosis with New York Heart Association class II symptoms of exertional fatigue and shortness of breath as well as some chest tightness consistent with chronic diastolic congestive heart failure.  I think she has probably had some development of symptoms over the past few months.  I have personally reviewed her 2D echocardiogram and gated cardiac CTA images.  I reviewed these with her and her husband.  Her echocardiogram shows a functionally bicuspid aortic valve that is heavily calcified with poor leaflet mobility and a mean gradient of 51 mmHg consistent with severe aortic stenosis.  Left ventricular function is preserved.  Her gated cardiac CTA shows an annular area of 697 mm which is too large for a transcatheter aortic valve  replacement.  In addition functionally bicuspid aortic valves are not ideal for transcatheter aortic valve replacement.  I would be hesitant to recommend a transcatheter aortic valve even if her annular size was adequate since she is only 62 years old and we do not know with the longevity of these valves will be.  Discussed all this with her and her husband and they seem to understand and are in agreement with proceeding with open surgical aortic valve replacement.  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  I recommended just replacing her aortic valve.  I discussed the options of bioprosthetic versus mechanical prostheses.  She is not interested in being on Coumadin and would like to use a bioprosthetic pericardial valve.  Think that is a reasonable alternative given her age.  The current [employer] INSPIRIS RESILIA pericardial valve is expected to have excellent longevity and she will have a large valve which should improve longevity and allow valve in valve transcatheter aortic valve replacement in the future if her valve does have significant structural deterioration in her lifetime.  This will obviate the need for lifetime anticoagulation with Coumadin and the 1 to 2 %/year risk of thromboembolism and anticoagulation related bleeding.  I discussed the operative procedure with the patient and her husband including alternatives, benefits and risks; including but not limited to bleeding, blood transfusion, infection, stroke, myocardial infarction, graft failure, heart block requiring a permanent pacemaker, organ dysfunction, and death.  [place] [id number] understands and agrees to proceed.    Plan:  Aortic valve replacement using a bioprosthetic valve   Gaye Pollack, MD [facility] [phone]

## 2018-01-10 NOTE — Anesthesia Preprocedure Evaluation (Addendum)
Anesthesia Evaluation  Patient identified by MRN, date of birth, ID band Patient awake    Reviewed: Allergy & Precautions, NPO status , Patient's Chart, lab work & pertinent test results  Airway Mallampati: II  TM Distance: >3 FB Neck ROM: Full    Dental  (+) Dental Advisory Given   Pulmonary pneumonia,    Pulmonary exam normal breath sounds clear to auscultation       Cardiovascular + Valvular Problems/Murmurs AS  Rhythm:Regular Rate:Normal + Systolic murmurs    Neuro/Psych negative neurological ROS  negative psych ROS   GI/Hepatic negative GI ROS, Neg liver ROS,   Endo/Other  Hypothyroidism   Renal/GU negative Renal ROS     Musculoskeletal  (+) Arthritis ,   Abdominal (+) + obese,   Peds  Hematology  (+) Blood dyscrasia, anemia ,   Anesthesia Other Findings   Reproductive/Obstetrics                             Anesthesia Physical Anesthesia Plan  ASA: IV  Anesthesia Plan: General   Post-op Pain Management:    Induction: Intravenous  PONV Risk Score and Plan: Ondansetron, Midazolam and Treatment may vary due to age or medical condition  Airway Management Planned: Oral ETT  Additional Equipment: Arterial line, CVP, PA Cath, TEE, 3D TEE and Ultrasound Guidance Line Placement  Intra-op Plan:   Post-operative Plan: Post-operative intubation/ventilation  Informed Consent: I have reviewed the patients History and Physical, chart, labs and discussed the procedure including the risks, benefits and alternatives for the proposed anesthesia with the patient or authorized representative who has indicated his/her understanding and acceptance.   Dental advisory given  Plan Discussed with: CRNA  Anesthesia Plan Comments:        Anesthesia Quick Evaluation

## 2018-01-11 ENCOUNTER — Inpatient Hospital Stay (HOSPITAL_COMMUNITY): Payer: BC Managed Care – PPO

## 2018-01-11 ENCOUNTER — Inpatient Hospital Stay (HOSPITAL_COMMUNITY): Payer: BC Managed Care – PPO | Admitting: Certified Registered Nurse Anesthetist

## 2018-01-11 ENCOUNTER — Other Ambulatory Visit: Payer: Self-pay

## 2018-01-11 ENCOUNTER — Inpatient Hospital Stay (HOSPITAL_COMMUNITY)
Admission: RE | Admit: 2018-01-11 | Discharge: 2018-01-16 | DRG: 220 | Disposition: A | Discharge status: Home / Self Care | Payer: BC Managed Care – PPO | Attending: Surgery | Admitting: Surgery

## 2018-01-11 ENCOUNTER — Inpatient Hospital Stay (HOSPITAL_COMMUNITY): Payer: BC Managed Care – PPO | Admitting: Physician Assistant

## 2018-01-11 ENCOUNTER — Encounter (HOSPITAL_COMMUNITY)
Admission: RE | Disposition: A | Discharge status: Home / Self Care | Payer: Self-pay | Source: Home / Self Care | Attending: Surgery

## 2018-01-11 ENCOUNTER — Encounter (HOSPITAL_COMMUNITY): Payer: Self-pay

## 2018-01-11 DIAGNOSIS — Z79890 Hormone replacement therapy (postmenopausal): Secondary | ICD-10-CM

## 2018-01-11 DIAGNOSIS — Z6836 Body mass index (BMI) 36.0-36.9, adult: Secondary | ICD-10-CM

## 2018-01-11 DIAGNOSIS — I2510 Atherosclerotic heart disease of native coronary artery without angina pectoris: Secondary | ICD-10-CM | POA: Diagnosis present

## 2018-01-11 DIAGNOSIS — E039 Hypothyroidism, unspecified: Secondary | ICD-10-CM | POA: Diagnosis present

## 2018-01-11 DIAGNOSIS — Z952 Presence of prosthetic heart valve: Secondary | ICD-10-CM

## 2018-01-11 DIAGNOSIS — I442 Atrioventricular block, complete: Secondary | ICD-10-CM | POA: Diagnosis not present

## 2018-01-11 DIAGNOSIS — E669 Obesity, unspecified: Secondary | ICD-10-CM | POA: Diagnosis present

## 2018-01-11 DIAGNOSIS — I350 Nonrheumatic aortic (valve) stenosis: Secondary | ICD-10-CM | POA: Diagnosis present

## 2018-01-11 DIAGNOSIS — I5032 Chronic diastolic (congestive) heart failure: Secondary | ICD-10-CM | POA: Diagnosis present

## 2018-01-11 DIAGNOSIS — E785 Hyperlipidemia, unspecified: Secondary | ICD-10-CM | POA: Diagnosis present

## 2018-01-11 DIAGNOSIS — Z006 Encounter for examination for normal comparison and control in clinical research program: Secondary | ICD-10-CM | POA: Diagnosis not present

## 2018-01-11 DIAGNOSIS — R110 Nausea: Secondary | ICD-10-CM | POA: Diagnosis not present

## 2018-01-11 HISTORY — PX: AORTIC VALVE REPLACEMENT: SHX41

## 2018-01-11 HISTORY — PX: TEE WITHOUT CARDIOVERSION: SHX5443

## 2018-01-11 LAB — GLUCOSE, CAPILLARY
GLUCOSE-CAPILLARY: 118 mg/dL — AB (ref 70–99)
GLUCOSE-CAPILLARY: 108 mg/dL — AB (ref 70–99)
GLUCOSE-CAPILLARY: 115 mg/dL — AB (ref 70–99)
GLUCOSE-CAPILLARY: 130 mg/dL — AB (ref 70–99)
GLUCOSE-CAPILLARY: 113 mg/dL — AB (ref 70–99)
Glucose-Capillary: 109 mg/dL — ABNORMAL HIGH (ref 70–99)
Glucose-Capillary: 105 mg/dL — ABNORMAL HIGH (ref 70–99)
Glucose-Capillary: 105 mg/dL — ABNORMAL HIGH (ref 70–99)
Glucose-Capillary: 118 mg/dL — ABNORMAL HIGH (ref 70–99)
Glucose-Capillary: 121 mg/dL — ABNORMAL HIGH (ref 70–99)

## 2018-01-11 LAB — CBC
HCT: 33.1 % — ABNORMAL LOW (ref 36.0–46.0)
HEMATOCRIT: 34 % — AB (ref 36.0–46.0)
HEMOGLOBIN: 10.6 g/dL — AB (ref 12.0–15.0)
Hemoglobin: 10.4 g/dL — ABNORMAL LOW (ref 12.0–15.0)
MCH: 28.2 pg (ref 26.0–34.0)
MCH: 28.6 pg (ref 26.0–34.0)
MCHC: 31.2 g/dL (ref 30.0–36.0)
MCHC: 31.4 g/dL (ref 30.0–36.0)
MCV: 90.4 fL (ref 80.0–100.0)
MCV: 90.9 fL (ref 80.0–100.0)
Platelets: 177 10*3/uL (ref 150–400)
Platelets: 188 10*3/uL (ref 150–400)
RBC: 3.76 MIL/uL — ABNORMAL LOW (ref 3.87–5.11)
RBC: 3.64 MIL/uL — ABNORMAL LOW (ref 3.87–5.11)
RDW: 13 % (ref 11.5–15.5)
RDW: 13.1 % (ref 11.5–15.5)
WBC: 15.9 10*3/uL — ABNORMAL HIGH (ref 4.0–10.5)
WBC: 15.5 10*3/uL — ABNORMAL HIGH (ref 4.0–10.5)
nRBC: 0 % (ref 0.0–0.2)
nRBC: 0 % (ref 0.0–0.2)

## 2018-01-11 LAB — POCT I-STAT, CHEM 8
BUN: 21 mg/dL — ABNORMAL HIGH (ref 6–20)
BUN: 18 mg/dL (ref 6–20)
BUN: 18 mg/dL (ref 6–20)
BUN: 19 mg/dL (ref 6–20)
BUN: 19 mg/dL (ref 6–20)
CALCIUM ION: 1.19 mmol/L (ref 1.15–1.40)
CALCIUM ION: 1.1 mmol/L — AB (ref 1.15–1.40)
CHLORIDE: 103 mmol/L (ref 98–111)
CHLORIDE: 108 mmol/L (ref 98–111)
CREATININE: 0.6 mg/dL (ref 0.44–1.00)
Calcium, Ion: 1.24 mmol/L (ref 1.15–1.40)
Calcium, Ion: 1.02 mmol/L — ABNORMAL LOW (ref 1.15–1.40)
Calcium, Ion: 1.08 mmol/L — ABNORMAL LOW (ref 1.15–1.40)
Chloride: 105 mmol/L (ref 98–111)
Chloride: 104 mmol/L (ref 98–111)
Chloride: 106 mmol/L (ref 98–111)
Creatinine, Ser: 0.6 mg/dL (ref 0.44–1.00)
Creatinine, Ser: 0.5 mg/dL (ref 0.44–1.00)
Creatinine, Ser: 0.6 mg/dL (ref 0.44–1.00)
Creatinine, Ser: 0.6 mg/dL (ref 0.44–1.00)
Glucose, Bld: 105 mg/dL — ABNORMAL HIGH (ref 70–99)
Glucose, Bld: 100 mg/dL — ABNORMAL HIGH (ref 70–99)
Glucose, Bld: 105 mg/dL — ABNORMAL HIGH (ref 70–99)
Glucose, Bld: 140 mg/dL — ABNORMAL HIGH (ref 70–99)
Glucose, Bld: 127 mg/dL — ABNORMAL HIGH (ref 70–99)
HCT: 31 % — ABNORMAL LOW (ref 36.0–46.0)
HCT: 27 % — ABNORMAL LOW (ref 36.0–46.0)
HEMATOCRIT: 36 % (ref 36.0–46.0)
HEMATOCRIT: 29 % — AB (ref 36.0–46.0)
HEMATOCRIT: 28 % — AB (ref 36.0–46.0)
HEMOGLOBIN: 12.2 g/dL (ref 12.0–15.0)
HEMOGLOBIN: 10.5 g/dL — AB (ref 12.0–15.0)
Hemoglobin: 9.9 g/dL — ABNORMAL LOW (ref 12.0–15.0)
Hemoglobin: 9.2 g/dL — ABNORMAL LOW (ref 12.0–15.0)
Hemoglobin: 9.5 g/dL — ABNORMAL LOW (ref 12.0–15.0)
POTASSIUM: 4.2 mmol/L (ref 3.5–5.1)
POTASSIUM: 4.1 mmol/L (ref 3.5–5.1)
POTASSIUM: 4.8 mmol/L (ref 3.5–5.1)
Potassium: 4.1 mmol/L (ref 3.5–5.1)
Potassium: 5 mmol/L (ref 3.5–5.1)
SODIUM: 140 mmol/L (ref 135–145)
SODIUM: 141 mmol/L (ref 135–145)
SODIUM: 137 mmol/L (ref 135–145)
SODIUM: 139 mmol/L (ref 135–145)
Sodium: 141 mmol/L (ref 135–145)
TCO2: 30 mmol/L (ref 22–32)
TCO2: 29 mmol/L (ref 22–32)
TCO2: 30 mmol/L (ref 22–32)
TCO2: 28 mmol/L (ref 22–32)
TCO2: 27 mmol/L (ref 22–32)

## 2018-01-11 LAB — HEMOGLOBIN AND HEMATOCRIT, BLOOD
HEMATOCRIT: 28.4 % — AB (ref 36.0–46.0)
Hemoglobin: 9.2 g/dL — ABNORMAL LOW (ref 12.0–15.0)

## 2018-01-11 LAB — CREATININE, SERUM: CREATININE: 0.78 mg/dL (ref 0.44–1.00)

## 2018-01-11 LAB — POCT I-STAT 3, ART BLOOD GAS (G3+)
ACID-BASE EXCESS: 1 mmol/L (ref 0.0–2.0)
Acid-Base Excess: 4 mmol/L — ABNORMAL HIGH (ref 0.0–2.0)
BICARBONATE: 25.4 mmol/L (ref 20.0–28.0)
Bicarbonate: 27.7 mmol/L (ref 20.0–28.0)
O2 SAT: 100 %
O2 SAT: 100 %
PCO2 ART: 38.5 mmHg (ref 32.0–48.0)
PCO2 ART: 39.7 mmHg (ref 32.0–48.0)
PH ART: 7.465 — AB (ref 7.350–7.450)
PO2 ART: 441 mmHg — AB (ref 83.0–108.0)
PO2 ART: 365 mmHg — AB (ref 83.0–108.0)
TCO2: 29 mmol/L (ref 22–32)
TCO2: 27 mmol/L (ref 22–32)
pH, Arterial: 7.414 (ref 7.350–7.450)

## 2018-01-11 LAB — PROTIME-INR
INR: 1.4
PROTHROMBIN TIME: 17 s — AB (ref 11.4–15.2)

## 2018-01-11 LAB — MAGNESIUM: Magnesium: 3.3 mg/dL — ABNORMAL HIGH (ref 1.7–2.4)

## 2018-01-11 LAB — APTT: aPTT: 35 seconds (ref 24–36)

## 2018-01-11 LAB — PLATELET COUNT: Platelets: 215 10*3/uL (ref 150–400)

## 2018-01-11 SURGERY — REPLACEMENT, AORTIC VALVE, OPEN
Anesthesia: General | Site: Chest

## 2018-01-11 MED ORDER — SODIUM CHLORIDE 0.9 % IV SOLN
INTRAVENOUS | Status: DC | PRN
  Administered 2018-01-11: 11:00:00 via INTRAVENOUS

## 2018-01-11 MED ORDER — SODIUM CHLORIDE 0.45 % IV SOLN
INTRAVENOUS | Status: DC | PRN
  Administered 2018-01-11: 12:00:00 via INTRAVENOUS

## 2018-01-11 MED ORDER — MIDAZOLAM HCL 2 MG/2ML IJ SOLN: 2.0000 mg | INTRAMUSCULAR | Status: DC | PRN

## 2018-01-11 MED ORDER — LACTATED RINGERS IV SOLN
INTRAVENOUS | Status: DC | PRN
  Administered 2018-01-11 (×2): via INTRAVENOUS

## 2018-01-11 MED ORDER — PHENYLEPHRINE HCL 10 MG/ML IJ SOLN
INTRAMUSCULAR | Status: DC | PRN
  Administered 2018-01-11: 40 ug via INTRAVENOUS

## 2018-01-11 MED ORDER — PROPOFOL 10 MG/ML IV BOLUS
INTRAVENOUS | Status: DC | PRN
  Administered 2018-01-11: 50 mg via INTRAVENOUS

## 2018-01-11 MED ORDER — SODIUM CHLORIDE 0.9% FLUSH
10.0000 mL | Freq: Two times a day (BID) | INTRAVENOUS | Status: DC
  Administered 2018-01-11 – 2018-01-12 (×4): 10 mL
  Administered 2018-01-13: 30 mL
  Administered 2018-01-13: 10 mL

## 2018-01-11 MED ORDER — ASPIRIN 81 MG PO CHEW: 324.0000 mg | CHEWABLE_TABLET | Freq: Every day | ORAL | Status: DC

## 2018-01-11 MED ORDER — HEPARIN SODIUM (PORCINE) 1000 UNIT/ML IJ SOLN
INTRAMUSCULAR | Status: AC
  Filled 2018-01-11: qty 1

## 2018-01-11 MED ORDER — LACTATED RINGERS IV SOLN: 500.0000 mL | Freq: Once | INTRAVENOUS | Status: DC | PRN

## 2018-01-11 MED ORDER — 0.9 % SODIUM CHLORIDE (POUR BTL) OPTIME
TOPICAL | Status: DC | PRN
  Administered 2018-01-11: 5000 mL

## 2018-01-11 MED ORDER — ASPIRIN EC 325 MG PO TBEC
325.0000 mg | DELAYED_RELEASE_TABLET | Freq: Every day | ORAL | Status: DC
  Administered 2018-01-12 – 2018-01-14 (×3): 325 mg via ORAL
  Filled 2018-01-11 (×3): qty 1

## 2018-01-11 MED ORDER — ACETAMINOPHEN 160 MG/5ML PO SOLN: 1000.0000 mg | Freq: Four times a day (QID) | ORAL | Status: DC

## 2018-01-11 MED ORDER — SODIUM CHLORIDE 0.9 % IV SOLN: INTRAVENOUS | Status: DC

## 2018-01-11 MED ORDER — PANTOPRAZOLE SODIUM 40 MG PO TBEC
40.0000 mg | DELAYED_RELEASE_TABLET | Freq: Every day | ORAL | Status: DC
  Administered 2018-01-13 – 2018-01-14 (×2): 40 mg via ORAL
  Filled 2018-01-11 (×2): qty 1

## 2018-01-11 MED ORDER — OXYCODONE HCL 5 MG PO TABS
5.0000 mg | ORAL_TABLET | ORAL | Status: DC | PRN
  Administered 2018-01-12: 5 mg via ORAL
  Filled 2018-01-11: qty 1

## 2018-01-11 MED ORDER — METOPROLOL TARTRATE 12.5 MG HALF TABLET: 12.5000 mg | ORAL_TABLET | Freq: Two times a day (BID) | ORAL | Status: DC

## 2018-01-11 MED ORDER — LEVOTHYROXINE SODIUM 50 MCG PO TABS
50.0000 ug | ORAL_TABLET | Freq: Every day | ORAL | Status: DC
  Administered 2018-01-12 – 2018-01-16 (×5): 50 ug via ORAL
  Filled 2018-01-11 (×5): qty 1

## 2018-01-11 MED ORDER — INSULIN REGULAR(HUMAN) IN NACL 100-0.9 UT/100ML-% IV SOLN: INTRAVENOUS | Status: DC

## 2018-01-11 MED ORDER — HEMOSTATIC AGENTS (NO CHARGE) OPTIME
TOPICAL | Status: DC | PRN
  Administered 2018-01-11: 1 via TOPICAL

## 2018-01-11 MED ORDER — ORAL CARE MOUTH RINSE
15.0000 mL | Freq: Two times a day (BID) | OROMUCOSAL | Status: DC
  Administered 2018-01-11: 15 mL via OROMUCOSAL

## 2018-01-11 MED ORDER — LACTATED RINGERS IV SOLN
INTRAVENOUS | Status: DC | PRN
  Administered 2018-01-11: 07:00:00 via INTRAVENOUS

## 2018-01-11 MED ORDER — METOPROLOL TARTRATE 25 MG/10 ML ORAL SUSPENSION: 12.5000 mg | Freq: Two times a day (BID) | ORAL | Status: DC

## 2018-01-11 MED ORDER — FAMOTIDINE IN NACL 20-0.9 MG/50ML-% IV SOLN
20.0000 mg | Freq: Two times a day (BID) | INTRAVENOUS | Status: AC
  Administered 2018-01-11 (×2): 20 mg via INTRAVENOUS
  Filled 2018-01-11: qty 50

## 2018-01-11 MED ORDER — PHENYLEPHRINE 40 MCG/ML (10ML) SYRINGE FOR IV PUSH (FOR BLOOD PRESSURE SUPPORT)
PREFILLED_SYRINGE | INTRAVENOUS | Status: AC
  Filled 2018-01-11: qty 10

## 2018-01-11 MED ORDER — ROCURONIUM BROMIDE 50 MG/5ML IV SOSY
PREFILLED_SYRINGE | INTRAVENOUS | Status: AC
  Filled 2018-01-11: qty 20

## 2018-01-11 MED ORDER — SODIUM CHLORIDE 0.9% FLUSH: 3.0000 mL | INTRAVENOUS | Status: DC | PRN

## 2018-01-11 MED ORDER — SODIUM CHLORIDE 0.9 % IV SOLN
INTRAVENOUS | Status: DC | PRN
  Administered 2018-01-11: 750 mg via INTRAVENOUS

## 2018-01-11 MED ORDER — DEXMEDETOMIDINE HCL IN NACL 200 MCG/50ML IV SOLN: 0.0000 ug/kg/h | INTRAVENOUS | Status: DC

## 2018-01-11 MED ORDER — ORAL CARE MOUTH RINSE
15.0000 mL | OROMUCOSAL | Status: DC
  Administered 2018-01-11 (×2): 15 mL via OROMUCOSAL

## 2018-01-11 MED ORDER — SODIUM CHLORIDE 0.9 % IV SOLN
INTRAVENOUS | Status: DC | PRN
  Administered 2018-01-11: 20 ug/min via INTRAVENOUS

## 2018-01-11 MED ORDER — CHLORHEXIDINE GLUCONATE 4 % EX LIQD: 30.0000 mL | CUTANEOUS | Status: DC

## 2018-01-11 MED ORDER — ACETAMINOPHEN 650 MG RE SUPP: 650.0000 mg | Freq: Once | RECTAL | Status: DC

## 2018-01-11 MED ORDER — CHLORHEXIDINE GLUCONATE 0.12 % MT SOLN
15.0000 mL | OROMUCOSAL | Status: AC
  Administered 2018-01-11: 15 mL via OROMUCOSAL

## 2018-01-11 MED ORDER — PROPOFOL 10 MG/ML IV BOLUS
INTRAVENOUS | Status: AC
  Filled 2018-01-11: qty 20

## 2018-01-11 MED ORDER — INSULIN REGULAR BOLUS VIA INFUSION
0.0000 [IU] | Freq: Three times a day (TID) | INTRAVENOUS | Status: DC
  Filled 2018-01-11: qty 10

## 2018-01-11 MED ORDER — PROTAMINE SULFATE 10 MG/ML IV SOLN
INTRAVENOUS | Status: AC
  Filled 2018-01-11: qty 10

## 2018-01-11 MED ORDER — MAGNESIUM SULFATE 4 GM/100ML IV SOLN
4.0000 g | Freq: Once | INTRAVENOUS | Status: AC
  Administered 2018-01-11: 4 g via INTRAVENOUS
  Filled 2018-01-11: qty 100

## 2018-01-11 MED ORDER — PROTAMINE SULFATE 10 MG/ML IV SOLN
INTRAVENOUS | Status: DC | PRN
  Administered 2018-01-11: 350 mg via INTRAVENOUS

## 2018-01-11 MED ORDER — BISACODYL 10 MG RE SUPP: 10.0000 mg | Freq: Every day | RECTAL | Status: DC

## 2018-01-11 MED ORDER — ONDANSETRON HCL 4 MG/2ML IJ SOLN
4.0000 mg | Freq: Four times a day (QID) | INTRAMUSCULAR | Status: DC | PRN
  Administered 2018-01-11 – 2018-01-13 (×3): 4 mg via INTRAVENOUS
  Filled 2018-01-11 (×3): qty 2

## 2018-01-11 MED ORDER — CHLORHEXIDINE GLUCONATE 0.12% ORAL RINSE (MEDLINE KIT): 15.0000 mL | Freq: Two times a day (BID) | OROMUCOSAL | Status: DC

## 2018-01-11 MED ORDER — LACTATED RINGERS IV SOLN: INTRAVENOUS | Status: DC

## 2018-01-11 MED ORDER — THROMBIN 5000 UNITS EX SOLR
CUTANEOUS | Status: AC
  Filled 2018-01-11: qty 10000

## 2018-01-11 MED ORDER — MORPHINE SULFATE (PF) 2 MG/ML IV SOLN
2.0000 mg | INTRAVENOUS | Status: DC | PRN
  Administered 2018-01-11: 4 mg via INTRAVENOUS
  Filled 2018-01-11: qty 2

## 2018-01-11 MED ORDER — SODIUM CHLORIDE 0.9% FLUSH
10.0000 mL | INTRAVENOUS | Status: DC | PRN
  Administered 2018-01-14: 10 mL
  Filled 2018-01-11: qty 40

## 2018-01-11 MED ORDER — MORPHINE SULFATE (PF) 2 MG/ML IV SOLN
1.0000 mg | INTRAVENOUS | Status: AC | PRN
  Administered 2018-01-11: 4 mg via INTRAVENOUS
  Filled 2018-01-11: qty 2

## 2018-01-11 MED ORDER — HEPARIN SODIUM (PORCINE) 1000 UNIT/ML IJ SOLN
INTRAMUSCULAR | Status: DC | PRN
  Administered 2018-01-11: 35000 [IU] via INTRAVENOUS

## 2018-01-11 MED ORDER — MIDAZOLAM HCL (PF) 10 MG/2ML IJ SOLN
INTRAMUSCULAR | Status: AC
  Filled 2018-01-11: qty 2

## 2018-01-11 MED ORDER — FENTANYL CITRATE (PF) 250 MCG/5ML IJ SOLN
INTRAMUSCULAR | Status: AC
  Filled 2018-01-11: qty 25

## 2018-01-11 MED ORDER — CHLORHEXIDINE GLUCONATE CLOTH 2 % EX PADS
6.0000 | MEDICATED_PAD | Freq: Every day | CUTANEOUS | Status: DC
  Administered 2018-01-11 – 2018-01-14 (×4): 6 via TOPICAL

## 2018-01-11 MED ORDER — ACETAMINOPHEN 160 MG/5ML PO SOLN: 650.0000 mg | Freq: Once | ORAL | Status: DC

## 2018-01-11 MED ORDER — VANCOMYCIN HCL IN DEXTROSE 1-5 GM/200ML-% IV SOLN
1000.0000 mg | Freq: Once | INTRAVENOUS | Status: AC
  Administered 2018-01-11: 1000 mg via INTRAVENOUS
  Filled 2018-01-11: qty 200

## 2018-01-11 MED ORDER — PHENYLEPHRINE HCL-NACL 20-0.9 MG/250ML-% IV SOLN: 0.0000 ug/min | INTRAVENOUS | Status: DC

## 2018-01-11 MED ORDER — DOCUSATE SODIUM 100 MG PO CAPS
200.0000 mg | ORAL_CAPSULE | Freq: Every day | ORAL | Status: DC
  Administered 2018-01-12 – 2018-01-13 (×2): 200 mg via ORAL
  Filled 2018-01-11 (×2): qty 2

## 2018-01-11 MED ORDER — MIDAZOLAM HCL 5 MG/5ML IJ SOLN
INTRAMUSCULAR | Status: DC | PRN
  Administered 2018-01-11: 2 mg via INTRAVENOUS
  Administered 2018-01-11: 4 mg via INTRAVENOUS
  Administered 2018-01-11 (×2): 2 mg via INTRAVENOUS

## 2018-01-11 MED ORDER — EPHEDRINE 5 MG/ML INJ
INTRAVENOUS | Status: AC
  Filled 2018-01-11: qty 10

## 2018-01-11 MED ORDER — ROCURONIUM BROMIDE 100 MG/10ML IV SOLN
INTRAVENOUS | Status: DC | PRN
  Administered 2018-01-11 (×4): 50 mg via INTRAVENOUS

## 2018-01-11 MED ORDER — POTASSIUM CHLORIDE 10 MEQ/50ML IV SOLN: 10.0000 meq | INTRAVENOUS | Status: AC

## 2018-01-11 MED ORDER — SODIUM CHLORIDE 0.9% FLUSH: 3.0000 mL | Freq: Two times a day (BID) | INTRAVENOUS | Status: DC

## 2018-01-11 MED ORDER — SODIUM CHLORIDE 0.9 % IV SOLN: 250.0000 mL | INTRAVENOUS | Status: DC

## 2018-01-11 MED ORDER — PNEUMOCOCCAL VAC POLYVALENT 25 MCG/0.5ML IJ INJ: 0.5000 mL | INJECTION | INTRAMUSCULAR | Status: DC | PRN

## 2018-01-11 MED ORDER — METOPROLOL TARTRATE 5 MG/5ML IV SOLN: 2.5000 mg | INTRAVENOUS | Status: DC | PRN

## 2018-01-11 MED ORDER — PROTAMINE SULFATE 10 MG/ML IV SOLN
INTRAVENOUS | Status: AC
  Filled 2018-01-11: qty 25

## 2018-01-11 MED ORDER — BISACODYL 5 MG PO TBEC
10.0000 mg | DELAYED_RELEASE_TABLET | Freq: Every day | ORAL | Status: DC
  Administered 2018-01-12 – 2018-01-13 (×2): 10 mg via ORAL
  Filled 2018-01-11 (×2): qty 2

## 2018-01-11 MED ORDER — TRAMADOL HCL 50 MG PO TABS
50.0000 mg | ORAL_TABLET | ORAL | Status: DC | PRN
  Administered 2018-01-12: 100 mg via ORAL
  Filled 2018-01-11: qty 2

## 2018-01-11 MED ORDER — SODIUM CHLORIDE 0.9 % IV SOLN
1.5000 g | Freq: Two times a day (BID) | INTRAVENOUS | Status: AC
  Administered 2018-01-11 – 2018-01-13 (×4): 1.5 g via INTRAVENOUS
  Filled 2018-01-11 (×4): qty 1.5

## 2018-01-11 MED ORDER — FENTANYL CITRATE (PF) 250 MCG/5ML IJ SOLN
INTRAMUSCULAR | Status: DC | PRN
  Administered 2018-01-11: 50 ug via INTRAVENOUS
  Administered 2018-01-11: 150 ug via INTRAVENOUS
  Administered 2018-01-11 (×2): 100 ug via INTRAVENOUS
  Administered 2018-01-11: 150 ug via INTRAVENOUS
  Administered 2018-01-11 (×2): 100 ug via INTRAVENOUS
  Administered 2018-01-11: 150 ug via INTRAVENOUS
  Administered 2018-01-11: 50 ug via INTRAVENOUS
  Administered 2018-01-11: 300 ug via INTRAVENOUS

## 2018-01-11 MED ORDER — ACETAMINOPHEN 500 MG PO TABS
1000.0000 mg | ORAL_TABLET | Freq: Four times a day (QID) | ORAL | Status: DC
  Administered 2018-01-11 – 2018-01-15 (×14): 1000 mg via ORAL
  Filled 2018-01-11 (×14): qty 2

## 2018-01-11 MED ORDER — ALBUMIN HUMAN 5 % IV SOLN
250.0000 mL | INTRAVENOUS | Status: AC | PRN
  Administered 2018-01-11 (×4): 12.5 g via INTRAVENOUS
  Filled 2018-01-11: qty 500

## 2018-01-11 MED ORDER — NITROGLYCERIN IN D5W 200-5 MCG/ML-% IV SOLN: 0.0000 ug/min | INTRAVENOUS | Status: DC

## 2018-01-11 MED ORDER — THROMBIN 5000 UNITS EX SOLR
OROMUCOSAL | Status: DC | PRN
  Administered 2018-01-11 (×2): via TOPICAL

## 2018-01-11 SURGICAL SUPPLY — 73 items
ADAPTER CARDIO PERF ANTE/RETRO (ADAPTER) ×4 IMPLANT
ADPR PRFSN 84XANTGRD RTRGD (ADAPTER) ×2
BAG DECANTER FOR FLEXI CONT (MISCELLANEOUS) ×2 IMPLANT
BLADE STERNUM SYSTEM 6 (BLADE) ×4 IMPLANT
BLADE SURG 15 STRL LF DISP TIS (BLADE) ×2 IMPLANT
BLADE SURG 15 STRL SS (BLADE) ×4
CANISTER SUCT 3000ML PPV (MISCELLANEOUS) ×4 IMPLANT
CANNULA GUNDRY RCSP 15FR (MISCELLANEOUS) ×4 IMPLANT
CANNULA SUMP PERICARDIAL (CANNULA) ×2 IMPLANT
CATH HEART VENT LEFT (CATHETERS) ×2 IMPLANT
CATH ROBINSON RED A/P 18FR (CATHETERS) ×12 IMPLANT
CATH THORACIC 36FR (CATHETERS) ×4 IMPLANT
CATH THORACIC 36FR RT ANG (CATHETERS) ×4 IMPLANT
CONT SPEC 4OZ CLIKSEAL STRL BL (MISCELLANEOUS) ×4 IMPLANT
COVER SURGICAL LIGHT HANDLE (MISCELLANEOUS) ×2 IMPLANT
COVER WAND RF STERILE (DRAPES) ×2 IMPLANT
CRADLE DONUT ADULT HEAD (MISCELLANEOUS) ×4 IMPLANT
DRAPE CARDIOVASCULAR INCISE (DRAPES) ×4
DRAPE SLUSH/WARMER DISC (DRAPES) ×4 IMPLANT
DRAPE SRG 135X102X78XABS (DRAPES) IMPLANT
DRSG COVADERM 4X14 (GAUZE/BANDAGES/DRESSINGS) ×4 IMPLANT
ELECT CAUTERY BLADE 6.4 (BLADE) ×4 IMPLANT
ELECT REM PT RETURN 9FT ADLT (ELECTROSURGICAL) ×8
ELECTRODE REM PT RTRN 9FT ADLT (ELECTROSURGICAL) ×4 IMPLANT
FELT TEFLON 1X6 (MISCELLANEOUS) ×6 IMPLANT
GAUZE SPONGE 4X4 12PLY STRL (GAUZE/BANDAGES/DRESSINGS) ×4 IMPLANT
GAUZE SPONGE 4X4 12PLY STRL LF (GAUZE/BANDAGES/DRESSINGS) ×2 IMPLANT
GLOVE BIO SURGEON STRL SZ 6 (GLOVE) IMPLANT
GLOVE BIO SURGEON STRL SZ 6.5 (GLOVE) ×2 IMPLANT
GLOVE BIO SURGEON STRL SZ7 (GLOVE) IMPLANT
GLOVE BIO SURGEON STRL SZ7.5 (GLOVE) IMPLANT
GLOVE BIO SURGEONS STRL SZ 6.5 (GLOVE) ×2
GLOVE BIOGEL PI IND STRL 6 (GLOVE) IMPLANT
GLOVE BIOGEL PI IND STRL 6.5 (GLOVE) IMPLANT
GLOVE BIOGEL PI INDICATOR 6 (GLOVE) ×4
GLOVE BIOGEL PI INDICATOR 6.5 (GLOVE) ×8
GLOVE EUDERMIC 7 POWDERFREE (GLOVE) ×8 IMPLANT
GLOVE INDICATOR 7.5 STRL GRN (GLOVE) ×4 IMPLANT
GOWN STRL REUS W/ TWL LRG LVL3 (GOWN DISPOSABLE) ×8 IMPLANT
GOWN STRL REUS W/ TWL XL LVL3 (GOWN DISPOSABLE) ×2 IMPLANT
GOWN STRL REUS W/TWL LRG LVL3 (GOWN DISPOSABLE) ×28
GOWN STRL REUS W/TWL XL LVL3 (GOWN DISPOSABLE) ×4
HEMOSTAT POWDER SURGIFOAM 1G (HEMOSTASIS) ×10 IMPLANT
HEMOSTAT SURGICEL 2X14 (HEMOSTASIS) ×4 IMPLANT
KIT BASIN OR (CUSTOM PROCEDURE TRAY) ×4 IMPLANT
KIT CATH CPB BARTLE (MISCELLANEOUS) ×4 IMPLANT
KIT SUCTION CATH 14FR (SUCTIONS) ×4 IMPLANT
KIT TURNOVER KIT B (KITS) ×4 IMPLANT
LINE VENT (MISCELLANEOUS) ×2 IMPLANT
NS IRRIG 1000ML POUR BTL (IV SOLUTION) ×22 IMPLANT
PACK E OPEN HEART (SUTURE) ×4 IMPLANT
PACK OPEN HEART (CUSTOM PROCEDURE TRAY) ×4 IMPLANT
PAD ARMBOARD 7.5X6 YLW CONV (MISCELLANEOUS) ×8 IMPLANT
SET CARDIOPLEGIA MPS 5001102 (MISCELLANEOUS) ×2 IMPLANT
SUT BONE WAX W31G (SUTURE) ×4 IMPLANT
SUT ETHIBON 2 0 V 52N 30 (SUTURE) ×8 IMPLANT
SUT PROLENE 3 0 SH DA (SUTURE) IMPLANT
SUT PROLENE 3 0 SH1 36 (SUTURE) ×4 IMPLANT
SUT PROLENE 4 0 RB 1 (SUTURE) ×16
SUT PROLENE 4-0 RB1 .5 CRCL 36 (SUTURE) ×6 IMPLANT
SUT STEEL 6MS V (SUTURE) IMPLANT
SUT VIC AB 1 CTX 36 (SUTURE) ×12
SUT VIC AB 1 CTX36XBRD ANBCTR (SUTURE) ×4 IMPLANT
SYSTEM SAHARA CHEST DRAIN ATS (WOUND CARE) ×4 IMPLANT
TAPE CLOTH SURG 4X10 WHT LF (GAUZE/BANDAGES/DRESSINGS) ×2 IMPLANT
TAPE PAPER 2X10 WHT MICROPORE (GAUZE/BANDAGES/DRESSINGS) ×2 IMPLANT
TOWEL GREEN STERILE (TOWEL DISPOSABLE) ×4 IMPLANT
TOWEL GREEN STERILE FF (TOWEL DISPOSABLE) ×4 IMPLANT
TRAY FOLEY SLVR 16FR TEMP STAT (SET/KITS/TRAYS/PACK) ×4 IMPLANT
UNDERPAD 30X30 (UNDERPADS AND DIAPERS) ×4 IMPLANT
VALVE AORTIC SZ27 INSP/RESIL (Valve) ×2 IMPLANT
VENT LEFT HEART 12002 (CATHETERS) ×4
WATER STERILE IRR 1000ML POUR (IV SOLUTION) ×8 IMPLANT

## 2018-01-11 NOTE — OR Nursing (Signed)
First call to [place] at 1047, second call to [place] at 38

## 2018-01-11 NOTE — Progress Notes (Signed)
Echocardiogram Echocardiogram Transesophageal has been performed.  Megan Duke 01/11/2018, 8:56 AM

## 2018-01-11 NOTE — Op Note (Signed)
CARDIOVASCULAR SURGERY OPERATIVE NOTE  01/11/2018 Megan Duke OYDXAJOIN [id number]  Surgeon:  Gaye Pollack, MD  First Assistant: Nicholes Rough,  PA-C   Preoperative Diagnosis:  Severe aortic stenosis   Postoperative Diagnosis:  Same   Procedure:  1. Median Sternotomy 2. Extracorporeal circulation 3.   Aortic valve replacement using a 27 mm Edwards INSPIRIS RESILIA pericardial valve.  Anesthesia:  General Endotracheal   Clinical History/Surgical Indication:  The patient is a 62 year old woman with a history of multinodular goiter and hypothyroidism, dyslipidemia, and aortic stenosis that was diagnosed by an incidental heart murmur. 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 She saw Dr. Dema Severin and was noted to have a systolic murmur suspicious for significant aortic stenosis. She said that she was asymptomatic at that time. A 2D echocardiogram was done on 09/10/2017 which showed a mean gradient across aortic valve of 40 mmHg and a peak gradient of 67 mmHg. Left ventricular function was normal with mild left ventricular hypertrophy. There is mild aortic insufficiency. She was referred to Dr. Claiborne Billings and underwent a gated cardiac CTA with calcium scoring on 10/24/2017. 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 Leaflets were severely thickened and calcified with restricted opening. The aortic sinus diameter was 42 mm. The sinotubular junction was 38 mm and the ascending aorta was 41 mm. The aorta tapered back down in the aortic arch to 28 x 25 mm. The aortic annular area was 697 mm with a perimeter of 98.1 mm. Mean diameter was 29.8 mm.Coronary artery anatomy appeared normal with no plaque in the left main, left circumflex, and right coronary arteries and minimal nonobstructive irregularities in the LAD. She had a follow-up  echocardiogram on 12/22/2017 which showed progression of the mean gradient across aortic valve to 51 mmHg with a peak of 89 mmHg. The dimensionless index was 0.15 with a valve area of 0.88 cm. Left ventricular ejection fraction remained normal at 60 to 65% with grade 2 diastolic dysfunction.   She has a functionally bicuspid aortic valve and stage D, severe, symptomatic aortic stenosis with New York Heart Association class II symptoms of exertional fatigue and shortness of breath as well as some chest tightness consistent with chronic diastolic congestive heart failure. I think she has probably had some development of symptoms over the past few months. I have personally reviewed her 2D echocardiogram and gated cardiac CTA images. I reviewed these with her and her husband. Her echocardiogram shows a functionally bicuspid aortic valve that is heavily calcified with poor leaflet mobility and a mean gradient of 51 mmHg consistent with severe aortic stenosis. Left ventricular function is preserved. Her gated cardiac CTA shows an annular area of 697 mm which is too large for a transcatheter aortic valve replacement. In addition functionally bicuspid aortic valves are not ideal for transcatheter aortic valve replacement. I would be hesitant to recommend a transcatheter aortic valve even if her annular size was adequate since she is only 62 years old and we do not know with the longevity of these valves will be. I discussed all this with her and her husband and they seem to understand and are in agreement with proceeding with open surgical aortic valve replacement. 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 I recommended just replacing her  aortic valve. I discussed the options of bioprosthetic versus mechanical prostheses. She is not interested in being on Coumadin and would like to use a  bioprosthetic pericardial valve. Think that is a reasonable alternative given her age. The current Edwards INSPIRIS RESILIApericardial valve is expected to have excellent longevity and she will have a large valve which should improve longevity and allow valve in valve transcatheter aortic valve replacement in the future if her valve does have significant structural deterioration in her lifetime.  I discussed the operative procedure with the patient andher husbandincluding alternatives, benefits and risks; including but not limited to bleeding, blood transfusion, infection, stroke, myocardial infarction, graft failure, heart block requiring a permanent pacemaker, organ dysfunction, and death. Megan Wiedbuschunderstands and agrees to proceed.   Preparation:  The patient was seen in the preoperative holding area and the correct patient, correct operation were confirmed with the patient after reviewing the medical record and catheterization. The consent was signed by me. Preoperative antibiotics were given. A pulmonary arterial line and radial arterial line were placed by the anesthesia team. The patient was taken back to the operating room and positioned supine on the operating room table. After being placed under general endotracheal anesthesia by the anesthesia team a foley catheter was placed. The neck, chest, abdomen, and both legs were prepped with betadine soap and solution and draped in the usual sterile manner. A surgical time-out was taken and the correct patient and operative procedure were confirmed with the nursing and anesthesia staff.   Pre-bypass TEE:   Complete TEE assessment was performed by Dr. Nolon Nations. This showed a functionally bicuspid aortic valve with severe stenosis, trivial regurgitation, normal LV systolic function.    Post-bypass TEE:   Normal functioning prosthetic aortic valve with no perivalvular leak or regurgitation through the valve. Left ventricular  function preserved. No mitral regurgitation.    Cardiopulmonary Bypass:  A median sternotomy was performed. The pericardium was opened in the midline. Right ventricular function appeared normal. The ascending aorta was of normal size and had no palpable plaque. There were no contraindications to aortic cannulation or cross-clamping. The patient was fully systemically heparinized and the ACT was maintained > 400 sec. The proximal aortic arch was cannulated with a 20 F aortic cannula for arterial inflow. Venous cannulation was performed via the right atrial appendage using a two-staged venous cannula. An antegrade cardioplegia/vent cannula was inserted into the mid-ascending aorta. A left ventricular vent was placed via the right superior pulmonary vein. A retrograde cardioplegia cannnula was placed into the coronary sinus via the right atrium. Aortic occlusion was performed with a single cross-clamp. Systemic cooling to 32 degrees Centigrade and topical cooling of the heart with iced saline were used. Hyperkalemic antegrade cold blood cardioplegia was used to induce diastolic arrest and then cold blood retrograde cardioplegia was given at about 20 minute intervals throughout the period of arrest to maintain myocardial temperature at or below 10 degrees centigrade. A temperature probe was inserted into the interventricular septum and an insulating pad was placed in the pericardium. Carbon dioxide was insufflated into the pericardium at 5L/min throughout the procedure to minimize intracardiac air.   Aortic Valve Replacement:   A transverse aortotomy was performed 1 cm above the take-off of the right coronary artery. The native valve was functionally bicuspid with calcified leaflets and mild annular calcification. The left and right coronary leaflets were fused with a single raphe. The ostia of the coronary arteries were in normal position and were not obstructed.  The native valve leaflets were excised and  the annulus was decalcified with rongeurs. Care was taken to remove all particulate debris. The left ventricle was directly inspected for debris and then irrigated with ice saline solution. The annulus was sized and a size 27 mm Edwards INSPIRIS RESILIA valve was chosen. The model number was [id number] and the serial number was [id number]. While the valve was being prepared 2-0 Ethibond pledgeted horizontal mattress sutures were placed around the annulus with the pledgets in a sub-annular position. The sutures were placed through the sewing ring and the valve lowered into place. The sutures were tied sequentially. The valve seated nicely and the coronary ostia were not obstructed. The prosthetic valve leaflets moved normally and there was no sub-valvular obstruction. The aortotomy was closed using 4-0 Prolene suture in 2 layers with felt strips to reinforce the closure.  Completion:  The patient was rewarmed to 37 degrees Centigrade. De-airing maneuvers were performed and the head placed in trendelenburg position. The crossclamp was removed with a time of 77 minutes. There was spontaneous return of sinus rhythm. The aortotomy was checked for hemostasis. Two temporary epicardial pacing wires were placed on the right atrium and two on the right ventricle. The left ventricular vent and retrograde cardioplegia cannulas were removed. The patient was weaned from CPB without difficulty on no inotropes. CPB time was 95 minutes. Cardiac output was 5 LPM. Heparin was fully reversed with protamine and the aortic and venous cannulas removed. Hemostasis was achieved. Mediastinal drainage tubes were placed. The sternum was closed with  #6 stainless steel wires. The fascia was closed with continuous # 1 vicryl suture. The subcutaneous tissue was closed with 2-0 vicryl continuous suture. The skin was closed with 3-0 vicryl subcuticular suture. All sponge, needle, and instrument counts were reported correct at the end of the case. Dry  sterile dressings were placed over the incisions and around the chest tubes which were connected to pleurevac suction. The patient was then transported to the surgical intensive care unit in stable condition.

## 2018-01-11 NOTE — Progress Notes (Signed)
When pt placed back on full support pt quickly became awake and asynchronous with vent.  Pt now placed back on PS 10/5 tolerating well. RT will monitor

## 2018-01-11 NOTE — Anesthesia Procedure Notes (Signed)
Procedure Name: Intubation Date/Time: 01/11/2018 7:46 AM Performed by: Shirlyn Goltz, CRNA Pre-anesthesia Checklist: Patient identified, Emergency Drugs available, Suction available and Patient being monitored Patient Re-evaluated:Patient Re-evaluated prior to induction Oxygen Delivery Method: Circle system utilized Preoxygenation: Pre-oxygenation with 100% oxygen Induction Type: IV induction Ventilation: Mask ventilation without difficulty Laryngoscope Size: Mac and 3 Grade View: Grade II Tube type: Oral Tube size: 8.0 mm Number of attempts: 1 Airway Equipment and Method: Stylet Placement Confirmation: ETT inserted through vocal cords under direct vision,  positive ETCO2 and breath sounds checked- equal and bilateral Secured at: 20 cm Tube secured with: Tape Dental Injury: Teeth and Oropharynx as per pre-operative assessment

## 2018-01-11 NOTE — Interval H&P Note (Signed)
History and Physical Interval Note:  01/11/2018 7:40 AM  Shivonne Schwartzman [id number]  has presented today for surgery, with the diagnosis of SEVERE AS  The various methods of treatment have been discussed with the patient and family. After consideration of risks, benefits and other options for treatment, the patient has consented to  Procedure(s): AORTIC VALVE REPLACEMENT (AVR) (N/A) TRANSESOPHAGEAL ECHOCARDIOGRAM (TEE) (N/A) as a surgical intervention .  The patient's history has been reviewed, patient examined, no change in status, stable for surgery.  I have reviewed the patient's chart and labs.  Questions were answered to the patient's satisfaction.     Gaye Pollack

## 2018-01-11 NOTE — Anesthesia Procedure Notes (Signed)
Arterial Line Insertion Start/End11/01/2018 6:50 AM, 01/11/2018 7:00 AM Performed by: Shirlyn Goltz, CRNA, CRNA  Patient location: Pre-op. Preanesthetic checklist: patient identified, IV checked, site marked, risks and benefits discussed, surgical consent, monitors and equipment checked, pre-op evaluation and anesthesia consent Lidocaine 1% used for infiltration and patient sedated Left, radial was placed Catheter size: 20 G Hand hygiene performed , maximum sterile barriers used  and Seldinger technique used Allen's test indicative of satisfactory collateral circulation Attempts: 1 Procedure performed without using ultrasound guided technique. Following insertion, dressing applied and Biopatch. Post procedure assessment: normal

## 2018-01-11 NOTE — Progress Notes (Signed)
1449 - Rapid wean started at this time

## 2018-01-11 NOTE — Progress Notes (Signed)
Patient ID: Megan Duke [id number], female   DOB: Mar 25, 1955, 62 y.o.   MRN: [id number] EVENING ROUNDS NOTE :     [address]       [place] [address]             [phone]                 Day of Surgery Procedure(s) (LRB): AORTIC VALVE REPLACEMENT (AVR) (N/A) TRANSESOPHAGEAL ECHOCARDIOGRAM (TEE) (N/A)  Total Length of Stay:  LOS: 0 days  BP 116/65   Pulse 90   Temp 98.2 F (36.8 C)   Resp 14   Ht 5\' 6"  (1.676 m)   Wt 98.5 kg   SpO2 100%   BMI 35.05 kg/m   .Intake/Output      11/10 0701 - 11/11 0700 11/11 0701 - 11/12 0700   I.V. (mL/kg)  1954.2 (19.8)   Blood  380   IV Piggyback  846.4   Total Intake(mL/kg)  3180.6 (32.3)   Urine (mL/kg/hr)  1085 (1)   Blood  800   Chest Tube  300   Total Output  2185   Net  +995.6          . sodium chloride 20 mL/hr at 01/11/18 1200  . [START ON 01/12/2018] sodium chloride    . sodium chloride 20 mL/hr at 01/11/18 1300  . cefUROXime (ZINACEF)  IV    . dexmedetomidine (PRECEDEX) IV infusion    . famotidine (PEPCID) IV 20 mg (01/11/18 1200)  . insulin    . lactated ringers    . lactated ringers    . lactated ringers    . nitroGLYCERIN 5 mcg/min (01/11/18 1324)  . phenylephrine (NEO-SYNEPHRINE) Adult infusion    . vancomycin       Lab Results  Component Value Date   WBC 15.5 (H) 01/11/2018   HGB 10.4 (L) 01/11/2018   HCT 33.1 (L) 01/11/2018   PLT 188 01/11/2018   GLUCOSE 127 (H) 01/11/2018   ALT 11 01/07/2018   AST 17 01/07/2018   NA 139 01/11/2018   K 4.8 01/11/2018   CL 106 01/11/2018   CREATININE 0.78 01/11/2018   BUN 19 01/11/2018   CO2 24 01/07/2018   INR 1.40 01/11/2018   HGBA1C 5.1 01/07/2018   Stable early post op Not bleeding    Grace Isaac MD  Beeper [phone] Office [phone] 01/11/2018 6:30 PM

## 2018-01-11 NOTE — Anesthesia Procedure Notes (Signed)
Central Venous Catheter Insertion Performed by: Catalina Gravel, MD, anesthesiologist Start/End11/01/2018 7:00 AM, 01/11/2018 7:05 AM Patient location: Pre-op. Preanesthetic checklist: patient identified, IV checked, site marked, risks and benefits discussed, surgical consent, monitors and equipment checked, pre-op evaluation, timeout performed and anesthesia consent Hand hygiene performed  and maximum sterile barriers used  Total catheter length 100. PA cath was placed.Swan type:thermodilution PA Cath depth:53 Procedure performed without using ultrasound guided technique. Attempts: 1 Patient tolerated the procedure well with no immediate complications.

## 2018-01-11 NOTE — Procedures (Signed)
Extubation Procedure Note  Patient Details:   Name: Megan Duke OVFIEPPIR DOB: [birth year] MRN: [id number]   Airway Documentation:    Vent end date: 01/11/18 Vent end time: 1631   Evaluation  O2 sats: stable throughout Complications: No apparent complications Patient did tolerate procedure well. Bilateral Breath Sounds: Clear, Diminished   Pt extubated per rapid wean protocol.  NIF -40, VC 1.8L Pt able to voice & strong cough   [name] 01/11/2018, 4:39 PM

## 2018-01-11 NOTE — Anesthesia Procedure Notes (Signed)
Central Venous Catheter Insertion Performed by: Catalina Gravel, MD, anesthesiologist Start/End11/01/2018 6:50 AM, 01/11/2018 7:00 AM Patient location: Pre-op. Preanesthetic checklist: patient identified, IV checked, site marked, risks and benefits discussed, surgical consent, monitors and equipment checked, pre-op evaluation, timeout performed and anesthesia consent Lidocaine 1% used for infiltration and patient sedated Hand hygiene performed  and maximum sterile barriers used  Catheter size: 8.5 Fr Sheath introducer Procedure performed using ultrasound guided technique. Ultrasound Notes:anatomy identified, needle tip was noted to be adjacent to the nerve/plexus identified, no ultrasound evidence of intravascular and/or intraneural injection and image(s) printed for medical record Attempts: 1 Following insertion, line sutured, dressing applied and Biopatch. Post procedure assessment: blood return through all ports, free fluid flow and no air  Patient tolerated the procedure well with no immediate complications.

## 2018-01-11 NOTE — Transfer of Care (Signed)
Immediate Anesthesia Transfer of Care Note  Patient: Megan Duke  Procedure(s) Performed: AORTIC VALVE REPLACEMENT (AVR) (N/A Chest) TRANSESOPHAGEAL ECHOCARDIOGRAM (TEE) (N/A )  Patient Location: ICU  Anesthesia Type:General  Level of Consciousness: sedated and Patient remains intubated per anesthesia plan  Airway & Oxygen Therapy: Patient remains intubated per anesthesia plan and Patient placed on Ventilator (see vital sign flow sheet for setting)  Post-op Assessment: Report given to RN and Post -op Vital signs reviewed and stable BP 118/76 aline; pacing; spo2 99% on ambu 100% fio2  Post vital signs: Reviewed and stable  Last Vitals:  Vitals Value Taken Time  BP    Temp    Pulse    Resp 12 01/11/2018 11:55 AM  SpO2    Vitals shown include unvalidated device data.  Last Pain:  Vitals:   01/11/18 0631  TempSrc:   PainSc: 0-No pain      Patients Stated Pain Goal: 3 (87/86/76 7209)  Complications: No apparent anesthesia complications

## 2018-01-11 NOTE — Progress Notes (Signed)
1545 - without stimulation pt will fall asleep with decreased RR, placed pt back on a RR of 12 at this time.  Will retry wean when pt more awake.

## 2018-01-11 NOTE — Anesthesia Postprocedure Evaluation (Signed)
Anesthesia Post Note  Patient: Megan Duke [id number]  Procedure(s) Performed: AORTIC VALVE REPLACEMENT (AVR) (N/A Chest) TRANSESOPHAGEAL ECHOCARDIOGRAM (TEE) (N/A )     Patient location during evaluation: SICU Anesthesia Type: General Level of consciousness: sedated and patient remains intubated per anesthesia plan Pain management: pain level controlled Vital Signs Assessment: post-procedure vital signs reviewed and stable Respiratory status: patient remains intubated per anesthesia plan Cardiovascular status: stable Anesthetic complications: no    Last Vitals:  Vitals:   01/11/18 1500 01/11/18 1510  BP: 103/65 116/65  Pulse: 89 90  Resp: 16 12  Temp: 36.8 C   SpO2: 99% 100%    Last Pain:  Vitals:   01/11/18 1200  TempSrc: Core  PainSc:                  Nolon Nations

## 2018-01-12 ENCOUNTER — Inpatient Hospital Stay (HOSPITAL_COMMUNITY): Payer: BC Managed Care – PPO

## 2018-01-12 ENCOUNTER — Encounter (HOSPITAL_COMMUNITY): Payer: Self-pay | Admitting: Surgery

## 2018-01-12 LAB — CBC
HCT: 30.9 % — ABNORMAL LOW (ref 36.0–46.0)
HCT: 29.9 % — ABNORMAL LOW (ref 36.0–46.0)
HEMOGLOBIN: 9.5 g/dL — AB (ref 12.0–15.0)
Hemoglobin: 9.6 g/dL — ABNORMAL LOW (ref 12.0–15.0)
MCH: 27.9 pg (ref 26.0–34.0)
MCH: 29.4 pg (ref 26.0–34.0)
MCHC: 30.7 g/dL (ref 30.0–36.0)
MCHC: 32.1 g/dL (ref 30.0–36.0)
MCV: 90.9 fL (ref 80.0–100.0)
MCV: 91.4 fL (ref 80.0–100.0)
NRBC: 0 % (ref 0.0–0.2)
PLATELETS: 175 10*3/uL (ref 150–400)
Platelets: 188 10*3/uL (ref 150–400)
RBC: 3.4 MIL/uL — ABNORMAL LOW (ref 3.87–5.11)
RBC: 3.27 MIL/uL — ABNORMAL LOW (ref 3.87–5.11)
RDW: 13.3 % (ref 11.5–15.5)
RDW: 13.3 % (ref 11.5–15.5)
WBC: 14.1 10*3/uL — AB (ref 4.0–10.5)
WBC: 14.3 10*3/uL — AB (ref 4.0–10.5)
nRBC: 0 % (ref 0.0–0.2)

## 2018-01-12 LAB — POCT I-STAT, CHEM 8
BUN: 16 mg/dL (ref 6–20)
BUN: 15 mg/dL (ref 6–20)
CALCIUM ION: 1.24 mmol/L (ref 1.15–1.40)
Calcium, Ion: 1.19 mmol/L (ref 1.15–1.40)
Chloride: 110 mmol/L (ref 98–111)
Chloride: 102 mmol/L (ref 98–111)
Creatinine, Ser: 0.6 mg/dL (ref 0.44–1.00)
Creatinine, Ser: 0.7 mg/dL (ref 0.44–1.00)
GLUCOSE: 120 mg/dL — AB (ref 70–99)
Glucose, Bld: 120 mg/dL — ABNORMAL HIGH (ref 70–99)
HCT: 35 % — ABNORMAL LOW (ref 36.0–46.0)
HEMATOCRIT: 29 % — AB (ref 36.0–46.0)
HEMOGLOBIN: 11.9 g/dL — AB (ref 12.0–15.0)
HEMOGLOBIN: 9.9 g/dL — AB (ref 12.0–15.0)
POTASSIUM: 4.2 mmol/L (ref 3.5–5.1)
Potassium: 4 mmol/L (ref 3.5–5.1)
SODIUM: 135 mmol/L (ref 135–145)
Sodium: 139 mmol/L (ref 135–145)
TCO2: 22 mmol/L (ref 22–32)
TCO2: 25 mmol/L (ref 22–32)

## 2018-01-12 LAB — GLUCOSE, CAPILLARY
GLUCOSE-CAPILLARY: 143 mg/dL — AB (ref 70–99)
Glucose-Capillary: 100 mg/dL — ABNORMAL HIGH (ref 70–99)
Glucose-Capillary: 107 mg/dL — ABNORMAL HIGH (ref 70–99)
Glucose-Capillary: 115 mg/dL — ABNORMAL HIGH (ref 70–99)
Glucose-Capillary: 133 mg/dL — ABNORMAL HIGH (ref 70–99)

## 2018-01-12 LAB — CREATININE, SERUM: Creatinine, Ser: 0.8 mg/dL (ref 0.44–1.00)

## 2018-01-12 LAB — POCT I-STAT 4, (NA,K, GLUC, HGB,HCT)
GLUCOSE: 107 mg/dL — AB (ref 70–99)
HEMATOCRIT: 32 % — AB (ref 36.0–46.0)
Hemoglobin: 10.9 g/dL — ABNORMAL LOW (ref 12.0–15.0)
POTASSIUM: 4.1 mmol/L (ref 3.5–5.1)
SODIUM: 142 mmol/L (ref 135–145)

## 2018-01-12 LAB — POCT I-STAT 3, ART BLOOD GAS (G3+)
ACID-BASE DEFICIT: 2 mmol/L (ref 0.0–2.0)
ACID-BASE DEFICIT: 4 mmol/L — AB (ref 0.0–2.0)
Acid-base deficit: 4 mmol/L — ABNORMAL HIGH (ref 0.0–2.0)
BICARBONATE: 22.9 mmol/L (ref 20.0–28.0)
BICARBONATE: 20.7 mmol/L (ref 20.0–28.0)
BICARBONATE: 21.4 mmol/L (ref 20.0–28.0)
O2 SAT: 99 %
O2 SAT: 96 %
O2 Saturation: 98 %
PCO2 ART: 28.6 mmHg — AB (ref 32.0–48.0)
PH ART: 7.316 — AB (ref 7.350–7.450)
PO2 ART: 100 mmHg (ref 83.0–108.0)
PO2 ART: 125 mmHg — AB (ref 83.0–108.0)
Patient temperature: 37.5
TCO2: 24 mmol/L (ref 22–32)
TCO2: 22 mmol/L (ref 22–32)
TCO2: 23 mmol/L (ref 22–32)
pCO2 arterial: 44.1 mmHg (ref 32.0–48.0)
pCO2 arterial: 38.8 mmHg (ref 32.0–48.0)
pH, Arterial: 7.469 — ABNORMAL HIGH (ref 7.350–7.450)
pH, Arterial: 7.353 (ref 7.350–7.450)
pO2, Arterial: 86 mmHg (ref 83.0–108.0)

## 2018-01-12 LAB — BASIC METABOLIC PANEL
Anion gap: 5 (ref 5–15)
BUN: 12 mg/dL (ref 6–20)
CHLORIDE: 106 mmol/L (ref 98–111)
CO2: 21 mmol/L — ABNORMAL LOW (ref 22–32)
CREATININE: 0.64 mg/dL (ref 0.44–1.00)
Calcium: 7.7 mg/dL — ABNORMAL LOW (ref 8.9–10.3)
GFR calc non Af Amer: >60 mL/min (ref >60)
Glucose, Bld: 120 mg/dL — ABNORMAL HIGH (ref 70–99)
Potassium: 3.8 mmol/L (ref 3.5–5.1)
SODIUM: 132 mmol/L — AB (ref 135–145)

## 2018-01-12 LAB — MAGNESIUM
MAGNESIUM: 2.4 mg/dL (ref 1.7–2.4)
Magnesium: 2.5 mg/dL — ABNORMAL HIGH (ref 1.7–2.4)

## 2018-01-12 MED ORDER — TRAMADOL HCL 50 MG PO TABS: 50.0000 mg | ORAL_TABLET | ORAL | Status: DC | PRN

## 2018-01-12 MED ORDER — ENOXAPARIN SODIUM 40 MG/0.4ML ~~LOC~~ SOLN
40.0000 mg | Freq: Every day | SUBCUTANEOUS | Status: DC
  Administered 2018-01-12 – 2018-01-15 (×4): 40 mg via SUBCUTANEOUS
  Filled 2018-01-12 (×4): qty 0.4

## 2018-01-12 MED ORDER — INSULIN ASPART 100 UNIT/ML ~~LOC~~ SOLN: 0.0000 [IU] | SUBCUTANEOUS | Status: DC

## 2018-01-12 MED ORDER — KETOROLAC TROMETHAMINE 15 MG/ML IJ SOLN
15.0000 mg | Freq: Four times a day (QID) | INTRAMUSCULAR | Status: DC | PRN
  Administered 2018-01-12: 15 mg via INTRAVENOUS
  Filled 2018-01-12 (×2): qty 1

## 2018-01-12 MED FILL — Gelatin Absorbable MT Powder: OROMUCOSAL | Qty: 1 | Status: AC

## 2018-01-12 MED FILL — Thrombin For Soln 5000 Unit: CUTANEOUS | Qty: 5000 | Status: AC

## 2018-01-12 NOTE — Progress Notes (Signed)
1 Day Post-Op Procedure(s) (LRB): AORTIC VALVE REPLACEMENT (AVR) (N/A) TRANSESOPHAGEAL ECHOCARDIOGRAM (TEE) (N/A) Subjective: Incisional soreness  Objective: Vital signs in last 24 hours: Temp:  [95.5 F (35.3 C)-100.8 F (38.2 C)] 99.7 F (37.6 C) (11/12 0700) Pulse Rate:  [78-91] 85 (11/12 0700) Cardiac Rhythm: A-V Sequential paced;Heart block (11/12 0400) Resp:  [12-21] 18 (11/12 0700) BP: (95-150)/(61-104) 104/66 (11/12 0700) SpO2:  [95 %-100 %] 100 % (11/12 0700) Arterial Line BP: (89-161)/(46-93) 113/48 (11/12 0700) FiO2 (%):  [40 %-50 %] 40 % (11/11 1600) Weight:  [99.6 kg] 99.6 kg (11/12 0523)  Hemodynamic parameters for last 24 hours: PAP: (10-23)/(0-12) 14/1 CO:  [3.3 L/min-5.9 L/min] 5.7 L/min CI:  [1.6 L/min/m2-2.8 L/min/m2] 2.7 L/min/m2  Intake/Output from previous day: 11/11 0701 - 11/12 0700 In: 4185.3 [I.V.:2307.2; Blood:380; IV Piggyback:1498.1] Out: 1610 [Urine:1750; Blood:800; Chest Tube:730] Intake/Output this shift: No intake/output data recorded.  General appearance: alert and cooperative Neurologic: intact Heart: regular rate and rhythm, S1, S2 normal, no murmur, click, rub or gallop Lungs: clear to auscultation bilaterally Extremities: extremities normal, atraumatic, no cyanosis or edema Wound: dressing dry  Lab Results: Recent Labs    01/11/18 1737 01/12/18 0500  WBC 15.5* 14.1*  HGB 10.4* 9.5*  HCT 33.1* 30.9*  PLT 188 188   BMET:  Recent Labs    01/11/18 1045 01/11/18 1737 01/12/18 0500  NA 139  --  132*  K 4.8  --  3.8  CL 106  --  106  CO2  --   --  21*  GLUCOSE 127*  --  120*  BUN 19  --  12  CREATININE 0.60 0.78 0.64  CALCIUM  --   --  7.7*    PT/INR:  Recent Labs    01/11/18 1159  LABPROT 17.0*  INR 1.40   ABG    Component Value Date/Time   PHART 7.414 01/11/2018 1048   HCO3 25.4 01/11/2018 1048   TCO2 27 01/11/2018 1048   O2SAT 100.0 01/11/2018 1048   CBG (last 3)  Recent Labs    01/12/18 0017  01/12/18 0120 01/12/18 0449  GLUCAP 100* 107* 115*   CXR: left base atelectasis  ECG: not done due to CHB  Assessment/Plan: S/P Procedure(s) (LRB): AORTIC VALVE REPLACEMENT (AVR) (N/A) TRANSESOPHAGEAL ECHOCARDIOGRAM (TEE) (N/A)  POD 1 Hemodynamically stable Rhythm is CHB with no ventricular escape for several seconds so pacer turned back on. Sensing atrium and pacing ventricle at 88. I turned pacer off last night and was in sinus but then went back into CHB. DC beta blocker. DC swan. Continue pacer and observe.  DC chest tubes, arterial line.  IS, OOB, ambulate as tolerated.   LOS: 1 day    Gaye Pollack 01/12/2018

## 2018-01-12 NOTE — Progress Notes (Signed)
[facility] BRIEF SICU PROGRESS NOTE  1 Day Post-Op  S/P Procedure(s) (LRB): AORTIC VALVE REPLACEMENT (AVR) (N/A) TRANSESOPHAGEAL ECHOCARDIOGRAM (TEE) (N/A)   Stable day AV paced w/ stable BP Breathing comfortably on 2 L/min UOP adequate  Plan: Continue current plan  Rexene Alberts, MD 01/12/2018 6:02 PM

## 2018-01-13 ENCOUNTER — Inpatient Hospital Stay (HOSPITAL_COMMUNITY): Payer: BC Managed Care – PPO

## 2018-01-13 LAB — CBC
HEMATOCRIT: 28.1 % — AB (ref 36.0–46.0)
Hemoglobin: 8.9 g/dL — ABNORMAL LOW (ref 12.0–15.0)
MCH: 29.1 pg (ref 26.0–34.0)
MCHC: 31.7 g/dL (ref 30.0–36.0)
MCV: 91.8 fL (ref 80.0–100.0)
NRBC: 0 % (ref 0.0–0.2)
Platelets: 155 10*3/uL (ref 150–400)
RBC: 3.06 MIL/uL — AB (ref 3.87–5.11)
RDW: 13.3 % (ref 11.5–15.5)
WBC: 12.1 10*3/uL — ABNORMAL HIGH (ref 4.0–10.5)

## 2018-01-13 LAB — BASIC METABOLIC PANEL
ANION GAP: 5 (ref 5–15)
BUN: 12 mg/dL (ref 6–20)
CALCIUM: 8.1 mg/dL — AB (ref 8.9–10.3)
CHLORIDE: 105 mmol/L (ref 98–111)
CO2: 23 mmol/L (ref 22–32)
Creatinine, Ser: 0.83 mg/dL (ref 0.44–1.00)
GFR calc non Af Amer: >60 mL/min (ref >60)
Glucose, Bld: 101 mg/dL — ABNORMAL HIGH (ref 70–99)
Potassium: 4 mmol/L (ref 3.5–5.1)
Sodium: 133 mmol/L — ABNORMAL LOW (ref 135–145)

## 2018-01-13 MED ORDER — METOCLOPRAMIDE HCL 5 MG/ML IJ SOLN
10.0000 mg | Freq: Four times a day (QID) | INTRAMUSCULAR | Status: AC
  Administered 2018-01-13 – 2018-01-14 (×4): 10 mg via INTRAVENOUS
  Filled 2018-01-13 (×4): qty 2

## 2018-01-13 MED ORDER — FUROSEMIDE 10 MG/ML IJ SOLN
40.0000 mg | Freq: Once | INTRAMUSCULAR | Status: AC
  Administered 2018-01-13: 40 mg via INTRAVENOUS
  Filled 2018-01-13: qty 4

## 2018-01-13 MED ORDER — POTASSIUM CHLORIDE CRYS ER 20 MEQ PO TBCR
20.0000 meq | EXTENDED_RELEASE_TABLET | Freq: Two times a day (BID) | ORAL | Status: AC
  Administered 2018-01-13 (×2): 20 meq via ORAL
  Filled 2018-01-13 (×2): qty 1

## 2018-01-13 NOTE — Progress Notes (Signed)
2 Days Post-Op Procedure(s) (LRB): AORTIC VALVE REPLACEMENT (AVR) (N/A) TRANSESOPHAGEAL ECHOCARDIOGRAM (TEE) (N/A) Subjective:  Nausea  Objective: Vital signs in last 24 hours: Temp:  [97.9 F (36.6 C)-99.1 F (37.3 C)] 98.1 F (36.7 C) (11/13 0812) Pulse Rate:  [79-85] 81 (11/13 0810) Cardiac Rhythm: A-V Sequential paced;Heart block (11/13 0810) Resp:  [4-21] 12 (11/13 0810) BP: (87-129)/(60-78) 112/69 (11/13 0810) SpO2:  [98 %-100 %] 99 % (11/13 0810) Weight:  [102.1 kg] 102.1 kg (11/13 0500)  Hemodynamic parameters for last 24 hours:    Intake/Output from previous day: 11/12 0701 - 11/13 0700 In: 1019.9 [P.O.:360; I.V.:460; IV Piggyback:199.9] Out: 930 [Urine:890; Chest Tube:40] Intake/Output this shift: Total I/O In: 120 [P.O.:120] Out: -   General appearance: alert and cooperative Neurologic: intact Heart: regular rate and rhythm, S1, S2 normal, no murmur, click, rub or gallop Lungs: clear to auscultation bilaterally Extremities: edema mild Wound: dressing dry  Lab Results: Recent Labs    01/12/18 1700 01/12/18 1704 01/13/18 0535  WBC 14.3*  --  12.1*  HGB 9.6* 9.9* 8.9*  HCT 29.9* 29.0* 28.1*  PLT 175  --  155   BMET:  Recent Labs    01/12/18 0500  01/12/18 1704 01/13/18 0535  NA 132*  --  135 133*  K 3.8  --  4.2 4.0  CL 106  --  102 105  CO2 21*  --   --  23  GLUCOSE 120*  --  120* 101*  BUN 12  --  15 12  CREATININE 0.64   < > 0.70 0.83  CALCIUM 7.7*  --   --  8.1*   < > = values in this interval not displayed.    PT/INR:  Recent Labs    01/11/18 1159  LABPROT 17.0*  INR 1.40   ABG    Component Value Date/Time   PHART 7.353 01/11/2018 1738   HCO3 21.4 01/11/2018 1738   TCO2 25 01/12/2018 1704   ACIDBASEDEF 4.0 (H) 01/11/2018 1738   O2SAT 96.0 01/11/2018 1738   CBG (last 3)  Recent Labs    01/12/18 0449 01/12/18 0824 01/12/18 1239  GLUCAP 115* 143* 133*   CXR: left basilar atelectasis  Assessment/Plan: S/P  Procedure(s) (LRB): AORTIC VALVE REPLACEMENT (AVR) (N/A) TRANSESOPHAGEAL ECHOCARDIOGRAM (TEE) (N/A)  POD 2 She has been hemodynamically stable. Still in complete heart block. I paused pacer and no conduction in 5 seconds so turned back on when she got dizzy. Will continue DDD 80 and observe. No beta blocker.  Mild volume excess: start diuresis.  Nausea: DC Toradol, add Reglan.  Continue ambulation and IS.  Keep in ICU since pacer dependent.   LOS: 2 days    Gaye Pollack 01/13/2018

## 2018-01-13 NOTE — Progress Notes (Signed)
      [address]       [place] [address]             [phone]      POD # 2 AVR  BP 104/60 (BP Location: Left Arm)   Pulse 88   Temp 98.1 F (36.7 C) (Oral)   Resp 14   Ht 5\' 6"  (1.676 m)   Wt 102.1 kg   SpO2 95%   BMI 36.32 kg/m   Intake/Output Summary (Last 24 hours) at 01/13/2018 1717 Last data filed at 01/13/2018 1600 Gross per 24 hour  Intake 839.92 ml  Output 3500 ml  Net -2660.08 ml   Diuresing well  Remo Lipps C. Roxan Hockey, MD [facility] [phone]

## 2018-01-14 MED ORDER — POTASSIUM CHLORIDE CRYS ER 20 MEQ PO TBCR
20.0000 meq | EXTENDED_RELEASE_TABLET | Freq: Three times a day (TID) | ORAL | Status: AC
  Administered 2018-01-14 (×3): 20 meq via ORAL
  Filled 2018-01-14 (×3): qty 1

## 2018-01-14 MED ORDER — FUROSEMIDE 10 MG/ML IJ SOLN
40.0000 mg | Freq: Once | INTRAMUSCULAR | Status: AC
  Administered 2018-01-14: 40 mg via INTRAVENOUS
  Filled 2018-01-14: qty 4

## 2018-01-14 NOTE — Progress Notes (Signed)
3 Days Post-Op Procedure(s) (LRB): AORTIC VALVE REPLACEMENT (AVR) (N/A) TRANSESOPHAGEAL ECHOCARDIOGRAM (TEE) (N/A) Subjective: No complaints. Walked around the ICU this am.  Objective: Vital signs in last 24 hours: Temp:  [97.8 F (36.6 C)-98.1 F (36.7 C)] 97.8 F (36.6 C) (11/14 0400) Pulse Rate:  [80-94] 83 (11/14 0700) Cardiac Rhythm: A-V Sequential paced;Heart block (11/14 0400) Resp:  [12-23] 17 (11/14 0700) BP: (99-134)/(59-77) 126/71 (11/14 0700) SpO2:  [91 %-100 %] 95 % (11/14 0700) Weight:  [100 kg] 100 kg (11/14 0500)  Hemodynamic parameters for last 24 hours:    Intake/Output from previous day: 11/13 0701 - 11/14 0700 In: 1310 [P.O.:1200; I.V.:10; IV Piggyback:100] Out: 3350 [Urine:3350] Intake/Output this shift: No intake/output data recorded.  General appearance: alert and cooperative Neurologic: intact Heart: regular rate and rhythm, S1, S2 normal, no murmur, click, rub or gallop Lungs: clear to auscultation bilaterally Extremities: extremities normal, atraumatic, no cyanosis or edema Wound: incision ok  Lab Results: Recent Labs    01/12/18 1700 01/12/18 1704 01/13/18 0535  WBC 14.3*  --  12.1*  HGB 9.6* 9.9* 8.9*  HCT 29.9* 29.0* 28.1*  PLT 175  --  155   BMET:  Recent Labs    01/12/18 0500  01/12/18 1704 01/13/18 0535  NA 132*  --  135 133*  K 3.8  --  4.2 4.0  CL 106  --  102 105  CO2 21*  --   --  23  GLUCOSE 120*  --  120* 101*  BUN 12  --  15 12  CREATININE 0.64   < > 0.70 0.83  CALCIUM 7.7*  --   --  8.1*   < > = values in this interval not displayed.    PT/INR:  Recent Labs    01/11/18 1159  LABPROT 17.0*  INR 1.40   ABG    Component Value Date/Time   PHART 7.353 01/11/2018 1738   HCO3 21.4 01/11/2018 1738   TCO2 25 01/12/2018 1704   ACIDBASEDEF 4.0 (H) 01/11/2018 1738   O2SAT 96.0 01/11/2018 1738   CBG (last 3)  Recent Labs    01/12/18 0449 01/12/18 0824 01/12/18 1239  GLUCAP 115* 143* 133*     Assessment/Plan: S/P Procedure(s) (LRB): AORTIC VALVE REPLACEMENT (AVR) (N/A) TRANSESOPHAGEAL ECHOCARDIOGRAM (TEE) (N/A)  POD 3 Hemodynamically stable Pacer turned down this am and she is back in sinus rhythm 90 with possible first degree block on monitor but CHB resolved. Complexes narrowed. Will keep on VVI 50 and observe. Check ECG. Preop ECG showed RBBB. No beta blocker.  Diuresed well yesterday. Weight still about 2.5-3 lbs over preop. Will give her another dose of lasix today.   DC sleeve  Continue IS and ambulation  Will observe in ICU today to be sure rhythm is stable and she is not pacer dependent.   LOS: 3 days    Gaye Pollack 01/14/2018

## 2018-01-14 NOTE — Progress Notes (Signed)
CT surgery p.m. Rounds  Patient's native heart rhythm now 90/min She has walked in the hallway twice No rhythm problems this evening

## 2018-01-15 LAB — CBC
HCT: 27.4 % — ABNORMAL LOW (ref 36.0–46.0)
Hemoglobin: 8.6 g/dL — ABNORMAL LOW (ref 12.0–15.0)
MCH: 28.5 pg (ref 26.0–34.0)
MCHC: 31.4 g/dL (ref 30.0–36.0)
MCV: 90.7 fL (ref 80.0–100.0)
Platelets: 234 10*3/uL (ref 150–400)
RBC: 3.02 MIL/uL — ABNORMAL LOW (ref 3.87–5.11)
RDW: 13.2 % (ref 11.5–15.5)
WBC: 9.8 10*3/uL (ref 4.0–10.5)
nRBC: 0 % (ref 0.0–0.2)

## 2018-01-15 LAB — BASIC METABOLIC PANEL
Anion gap: 7 (ref 5–15)
BUN: 12 mg/dL (ref 6–20)
CALCIUM: 8.3 mg/dL — AB (ref 8.9–10.3)
CO2: 26 mmol/L (ref 22–32)
CREATININE: 0.86 mg/dL (ref 0.44–1.00)
Chloride: 103 mmol/L (ref 98–111)
Glucose, Bld: 106 mg/dL — ABNORMAL HIGH (ref 70–99)
Potassium: 4.1 mmol/L (ref 3.5–5.1)
SODIUM: 136 mmol/L (ref 135–145)

## 2018-01-15 MED ORDER — MOVING RIGHT ALONG BOOK
Freq: Once | Status: DC
  Filled 2018-01-15: qty 1

## 2018-01-15 MED ORDER — SODIUM CHLORIDE 0.9 % IV SOLN: 250.0000 mL | INTRAVENOUS | Status: DC | PRN

## 2018-01-15 MED ORDER — OXYCODONE HCL 5 MG PO TABS: 5.0000 mg | ORAL_TABLET | ORAL | Status: DC | PRN

## 2018-01-15 MED ORDER — LEVALBUTEROL HCL 0.63 MG/3ML IN NEBU: 0.6300 mg | INHALATION_SOLUTION | Freq: Four times a day (QID) | RESPIRATORY_TRACT | Status: DC | PRN

## 2018-01-15 MED ORDER — SODIUM CHLORIDE 0.9% FLUSH
3.0000 mL | Freq: Two times a day (BID) | INTRAVENOUS | Status: DC
  Administered 2018-01-15: 3 mL via INTRAVENOUS

## 2018-01-15 MED ORDER — SODIUM CHLORIDE 0.9% FLUSH: 3.0000 mL | INTRAVENOUS | Status: DC | PRN

## 2018-01-15 MED ORDER — PANTOPRAZOLE SODIUM 40 MG PO TBEC
40.0000 mg | DELAYED_RELEASE_TABLET | Freq: Every day | ORAL | Status: DC
  Administered 2018-01-15 – 2018-01-16 (×2): 40 mg via ORAL
  Filled 2018-01-15 (×2): qty 1

## 2018-01-15 MED ORDER — TRAMADOL HCL 50 MG PO TABS: 50.0000 mg | ORAL_TABLET | ORAL | Status: DC | PRN

## 2018-01-15 MED ORDER — ONDANSETRON HCL 4 MG/2ML IJ SOLN: 4.0000 mg | Freq: Four times a day (QID) | INTRAMUSCULAR | Status: DC | PRN

## 2018-01-15 MED ORDER — ONDANSETRON HCL 4 MG PO TABS: 4.0000 mg | ORAL_TABLET | Freq: Four times a day (QID) | ORAL | Status: DC | PRN

## 2018-01-15 MED ORDER — ACETAMINOPHEN 325 MG PO TABS: 650.0000 mg | ORAL_TABLET | Freq: Four times a day (QID) | ORAL | Status: DC | PRN

## 2018-01-15 MED ORDER — ASPIRIN EC 325 MG PO TBEC
325.0000 mg | DELAYED_RELEASE_TABLET | Freq: Every day | ORAL | Status: DC
  Administered 2018-01-15 – 2018-01-16 (×2): 325 mg via ORAL
  Filled 2018-01-15 (×2): qty 1

## 2018-01-15 NOTE — Discharge Instructions (Signed)
Aortic Valve Replacement, Care After °Refer to this sheet in the next few weeks. These instructions provide you with information about caring for yourself after your procedure. Your health care provider may also give you more specific instructions. Your treatment has been planned according to current medical practices, but problems sometimes occur. Call your health care provider if you have any problems or questions after your procedure. °What can I expect after the procedure? °After the procedure, it is common to have: °· Pain around your incision area. °· A small amount of blood or clear fluid coming from your incision. ° °Follow these instructions at home: °Eating and drinking ° °· Follow instructions from your health care provider about eating or drinking restrictions. °? Limit alcohol intake to no more than 1 drink per day for nonpregnant women and 2 drinks per day for men. One drink equals 12 oz of beer, 5 oz of wine, or 1½ oz of hard liquor. °? Limit how much caffeine you drink. Caffeine can affect your heart's rate and rhythm. °· Drink enough fluid to keep your urine clear or pale yellow. °· Eat a heart-healthy diet. This should include plenty of fresh fruits and vegetables. If you eat meat, it should be lean cuts. Avoid foods that are: °? High in salt, saturated fat, or sugar. °? Canned or highly processed. °? Fried. °Activity °· Return to your normal activities as told by your health care provider. Ask your health care provider what activities are safe for you. °· Exercise regularly once you have recovered, as told by your health care provider. °· Avoid sitting for more than 2 hours at a time without moving. Get up and move around at least once every 1-2 hours. This helps to prevent blood clots in the legs. °· Do not lift anything that is heavier than 10 lb (4.5 kg) until your health care provider approves. °· Avoid pushing or pulling things with your arms until your health care provider approves. This  includes pulling on handrails to help you climb stairs. °Incision care ° °· Follow instructions from your health care provider about how to take care of your incision. Make sure you: °? Wash your hands with soap and water before you change your bandage (dressing). If soap and water are not available, use hand sanitizer. °? Change your dressing as told by your health care provider. °? Leave stitches (sutures), skin glue, or adhesive strips in place. These skin closures may need to stay in place for 2 weeks or longer. If adhesive strip edges start to loosen and curl up, you may trim the loose edges. Do not remove adhesive strips completely unless your health care provider tells you to do that. °· Check your incision area every day for signs of infection. Check for: °? More redness, swelling, or pain. °? More fluid or blood. °? Warmth. °? Pus or a bad smell. °Medicines °· Take over-the-counter and prescription medicines only as told by your health care provider. °· If you were prescribed an antibiotic medicine, take it as told by your health care provider. Do not stop taking the antibiotic even if you start to feel better. °Travel °· Avoid airplane travel for as long as told by your health care provider. °· When you travel, bring a list of your medicines and a record of your medical history with you. Carry your medicines with you. °Driving °· Ask your health care provider when it is safe for you to drive. Do not drive until your health   care provider approves. °· Do not drive or operate heavy machinery while taking prescription pain medicine. °Lifestyle ° °· Do not use any tobacco products, such as cigarettes, chewing tobacco, or e-cigarettes. If you need help quitting, ask your health care provider. °· Resume sexual activity as told by your health care provider. Do not use medicines for erectile dysfunction unless your health care provider approves, if this applies. °· Work with your health care provider to keep your  blood pressure and cholesterol under control, and to manage any other heart conditions that you have. °· Maintain a healthy weight. °General instructions °· Do not take baths, swim, or use a hot tub until your health care provider approves. °· Do not strain to have a bowel movement. °· Avoid crossing your legs while sitting down. °· Check your temperature every day for a fever. A fever may be a sign of infection. °· If you are a woman and you plan to become pregnant, talk with your health care provider before you become pregnant. °· Wear compression stockings if your health care provider instructs you to do this. These stockings help to prevent blood clots and reduce swelling in your legs. °· Tell all health care providers who care for you that you have an artificial (prosthetic) aortic valve. If you have or have had heart disease or endocarditis, tell all health care providers about these conditions as well. °· Keep all follow-up visits as told by your health care provider. This is important. °Contact a health care provider if: °· You develop a skin rash. °· You experience sudden, unexplained changes in your weight. °· You have more redness, swelling, or pain around your incision. °· You have more fluid or blood coming from your incision. °· Your incision feels warm to the touch. °· You have pus or a bad smell coming from your incision. °· You have a fever. °Get help right away if: °· You develop chest pain that is different from the pain coming from your incision. °· You develop shortness of breath or difficulty breathing. °· You start to feel light-headed. °These symptoms may represent a serious problem that is an emergency. Do not wait to see if the symptoms will go away. Get medical help right away. Call your local emergency services (911 in the [place] Do not drive yourself to the hospital. °This information is not intended to replace advice given to you by your health care provider. Make sure you discuss any  questions you have with your health care provider. °Document Released: 09/05/2004 Document Revised: 07/26/2015 Document Reviewed: 01/21/2015 °Elsevier Interactive Patient Education © 2017 [employer] ° °

## 2018-01-15 NOTE — Progress Notes (Signed)
Pt transferred to 4E-01 from [facility] via wheelchair with staff. Pt oriented to room and call bell. Call bell within reach. Pt given CHG bath TELE applied, CCMD notified. Will continue to monitor.  Amanda Cockayne, RN

## 2018-01-15 NOTE — Progress Notes (Signed)
4 Days Post-Op Procedure(s) (LRB): AORTIC VALVE REPLACEMENT (AVR) (N/A) TRANSESOPHAGEAL ECHOCARDIOGRAM (TEE) (N/A) Subjective: No complaints, says she is ready to go home.  Objective: Vital signs in last 24 hours: Temp:  [97.8 F (36.6 C)-98.5 F (36.9 C)] 98.3 F (36.8 C) (11/15 0400) Pulse Rate:  [78-100] 80 (11/15 0700) Cardiac Rhythm: Normal sinus rhythm (11/15 0400) Resp:  [19-24] 24 (11/14 0900) BP: (88-138)/(64-103) 138/86 (11/15 0700) SpO2:  [94 %-100 %] 98 % (11/15 0700) Weight:  [97.8 kg] 97.8 kg (11/15 0500)  Hemodynamic parameters for last 24 hours:    Intake/Output from previous day: 11/14 0701 - 11/15 0700 In: 360 [P.O.:360] Out: -  Intake/Output this shift: No intake/output data recorded.  General appearance: alert and cooperative Neurologic: intact Heart: regular rate and rhythm, S1, S2 normal, no murmur, click, rub or gallop Lungs: clear to auscultation bilaterally Extremities: extremities normal, atraumatic, no cyanosis or edema Wound: incision healing well  Lab Results: Recent Labs    01/13/18 0535 01/15/18 0222  WBC 12.1* 9.8  HGB 8.9* 8.6*  HCT 28.1* 27.4*  PLT 155 234   BMET:  Recent Labs    01/13/18 0535 01/15/18 0222  NA 133* 136  K 4.0 4.1  CL 105 103  CO2 23 26  GLUCOSE 101* 106*  BUN 12 12  CREATININE 0.83 0.86  CALCIUM 8.1* 8.3*    PT/INR: No results for input(s): LABPROT, INR in the last 72 hours. ABG    Component Value Date/Time   PHART 7.353 01/11/2018 1738   HCO3 21.4 01/11/2018 1738   TCO2 25 01/12/2018 1704   ACIDBASEDEF 4.0 (H) 01/11/2018 1738   O2SAT 96.0 01/11/2018 1738   CBG (last 3)  Recent Labs    01/12/18 0824 01/12/18 1239  GLUCAP 143* 133*    Assessment/Plan: S/P Procedure(s) (LRB): AORTIC VALVE REPLACEMENT (AVR) (N/A) TRANSESOPHAGEAL ECHOCARDIOGRAM (TEE) (N/A)  POD 4 Hemodynamically stable in sinus rhythm with no further CHB. Continue pacer to VVI 50 and monitor on telemetry today. If no  CHB today will remove pacing wires in am and send home tomorrow. No beta blocker with pre-existing RBBB and periop CHB.  Wt is at preop.  Bowels working Off oxygen Ambulating well Transfer to 4E and plan home tomorrow.   LOS: 4 days    Gaye Pollack 01/15/2018

## 2018-01-15 NOTE — Discharge Summary (Signed)
Physician Discharge Summary  Patient ID: Megan Duke MRN: [id number] DOB/AGE: [birth year] 61 y.o.  Admit date: 01/11/2018 Discharge date: 01/16/2018  Admission Diagnoses: Patient Active Problem List   Diagnosis Date Noted  . Severe aortic stenosis     Discharge Diagnoses:  Active Problems:   S/P AVR   Discharged Condition: good  HPI:   The patient is a 62 year old woman with a history of multinodular goiter and hypothyroidism, dyslipidemia, and aortic stenosis that was diagnosed by an incidental heart murmur. 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 She saw Dr. Dema Severin and was noted to have a systolic murmur suspicious for significant aortic stenosis. She said that she was asymptomatic at that time. A 2D echocardiogram was done on 09/10/2017 which showed a mean gradient across aortic valve of 40 mmHg and a peak gradient of 67 mmHg. Left ventricular function was normal with mild left ventricular hypertrophy. There is mild aortic insufficiency. She was referred to Dr. Claiborne Billings and underwent a gated cardiac CTA with calcium scoring on 10/24/2017. 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 Leaflets were severely thickened and calcified with restricted opening. The aortic sinus diameter was 42 mm. The sinotubular junction was 38 mm and the ascending aorta was 41 mm. The aorta tapered back down in the aortic arch to 28 x 25 mm. The aortic annular area was 697 mm with a perimeter of 98.1 mm. Mean diameter was 29.8 mm.Coronary artery anatomy appeared normal with no plaque in the left main, left circumflex, and right coronary arteries and minimal nonobstructive irregularities in the LAD. She had a follow-up echocardiogram on 12/22/2017 which showed progression of the mean gradient across aortic valve to 51 mmHg with a peak of 89  mmHg. The dimensionless index was 0.15 with a valve area of 0.88 cm. Left ventricular ejection fraction remained normal at 60 to 65% with grade 2 diastolic dysfunction.  The patient is here today with her husband. She said that since she was diagnosed with worsening aortic stenosis she has stopped exercising. He does report some exertional fatigue and shortness of breath with moderate activity. She has had no dizziness or syncope.She has had some chest tightness with exertion.She has had no orthopnea or PND.    Hospital Course:   On 01/11/2018 Ms.Megan Duke underwent an aortic valve replacement with Dr. Cyndia Bent.  She tolerated the procedure well and was transferred to the surgical ICU.  She is extubated timely manner.  Postop day 1 she remained hemodynamically stable.  Her rhythm was in complete heart block with no ventricular escape for several seconds.  She remained on an external pacer.  We discontinued her beta-blocker at this time.  We discontinued her Swan-Ganz catheter, chest tubes, and arterial line.  We encouraged ambulation.  Postop day 2 she was slightly nauseated and was treated with Zofran with resolution.  We discontinued her Toradol and added Reglan to assist.  She remained in complete heart block.  We started a diuretic regimen for fluid overload.  Postop day 3 we turn her pacer down and she was in normal sinus rhythm with a rate of 90.  It seems that her complete heart block has resolved.  We continue to hold her beta-blocker.  We will give another dose of Lasix for fluid overload.  She remained in the ICU to ensure her rhythm is stable.  Postop day 4 she was  significant stable and in normal sinus rhythm with no further heart block.  She remained on a back-up pacer 50 bpm.  Her weight is back to preop.  She is off oxygen and ambulating throughout the halls. She has remained in NSR and has not needing pacing for over 24 hours. We removed her pacing wires on 11.16.2019 and she is  clear for discharge 4 hours after removal.   Consults: None  Significant Diagnostic Studies:   CLINICAL DATA:  Status post aortic valve replacement.  EXAM: PORTABLE CHEST 1 VIEW  COMPARISON:  01/12/2018  FINDINGS: Sequelae of aortic valve replacement are again identified. Swan-Ganz catheter and mediastinal drains have been removed. A right jugular sheath remains. The cardiomediastinal silhouette is unchanged. Hazy opacity in the left lung base silhouetting the hemidiaphragm likely reflects a persistent small pleural effusion and atelectasis. There is minimal medial right basilar atelectasis. No pneumothorax is identified.  IMPRESSION: 1. Interval removal of some support devices as above. 2. Persistent small left pleural effusion and basilar atelectasis.   Electronically Signed   By: Logan Bores M.D.   On: 01/13/2018 08:18  Treatments:   01/11/2018 [place] [id number]  Surgeon:  Gaye Pollack, MD  First Assistant: Nicholes Rough,  PA-C   Preoperative Diagnosis:  Severe aortic stenosis   Postoperative Diagnosis:  Same   Procedure:  1. Median Sternotomy 2. Extracorporeal circulation 3.   Aortic valve replacement using a 27 mm Edwards INSPIRIS RESILIA pericardial valve.  Anesthesia:  General Endotracheal  Discharge Exam: Blood pressure (!) 144/80, pulse 92, temperature 98.9 F (37.2 C), temperature source Oral, resp. rate 19, height 5\' 6"  (1.676 m), weight 97.1 kg, SpO2 94 %.   General appearance: alert, cooperative and no distress Heart: regular rate and rhythm, S1, S2 normal, no murmur, click, rub or gallop Lungs: clear to auscultation bilaterally Abdomen: soft, non-tender; bowel sounds normal; no masses,  no organomegaly Extremities: extremities normal, atraumatic, no cyanosis or edema Wound: clean and dry   Disposition: Discharge disposition: 01-Home or Self Care        Allergies as of 01/16/2018   No Known  Allergies     Medication List    TAKE these medications   acetaminophen 325 MG tablet Commonly known as:  TYLENOL Take 2 tablets (650 mg total) by mouth every 6 (six) hours as needed for mild pain.   aspirin 325 MG EC tablet Take 1 tablet (325 mg total) by mouth daily.   levothyroxine 50 MCG tablet Commonly known as:  SYNTHROID, LEVOTHROID Take 50 mcg by mouth daily before breakfast.   oxyCODONE 5 MG immediate release tablet Commonly known as:  Oxy IR/ROXICODONE Take 1 tablet (5 mg total) by mouth every 6 (six) hours as needed for severe pain.      Follow-up Information    Gaye Pollack, MD Follow up.   Specialty:  Cardiothoracic Surgery Why:  Your routine follow-up appointment is on 02/15/2018 at 3 PM.  Please arrive at 2:30 PM for chest x-ray located at [facility] imaging which is on the first floor of our building. Contact information: [place] [address] [phone]        Nursing suture removal appointment Follow up.   Why:  Please arrive at 10 AM on 01/25/2017 for a suture removal appointment Contact information: Dr. Vivi Martens office       Harlan Stains, MD. Call in 1 day(s).   Specialty:  Family Medicine Contact information: [place]  [address] [place] [address] [phone]        Almyra Deforest, [place] Follow up.   Specialties:  Cardiology, Radiology Why:  Cardiology hospital follow up with Dr. Evette Georges PA. 01/27/18 at 3:00. Please arrive at 2:45 for check in.  Contact information: [address] [place] [address] [phone]          The patient has been discharged on:   1.Beta Blocker:  Yes [   ]                              No   [ no  ]                              If No, reason: CHB  2.Ace Inhibitor/ARB: Yes [   ]                                     No  [  no  ]                                     If No, reason: normotensive  3.Statin:   Yes [   ]                  No  [  no  ]                  If No, reason: no CAD  4.Shela CommonsVelta Addison  [ yes  ]                  No   [   ]                  If No, reason:   Signed: Elgie Collard 01/16/2018, 7:50 AM

## 2018-01-16 MED ORDER — ACETAMINOPHEN 325 MG PO TABS: 650.0000 mg | ORAL_TABLET | Freq: Four times a day (QID) | ORAL | Status: DC | PRN

## 2018-01-16 MED ORDER — OXYCODONE HCL 5 MG PO TABS: 5.0000 mg | ORAL_TABLET | Freq: Four times a day (QID) | ORAL | 0 refills | Status: DC | PRN

## 2018-01-16 MED ORDER — ASPIRIN 325 MG PO TBEC: 325.0000 mg | DELAYED_RELEASE_TABLET | Freq: Every day | ORAL | 0 refills | Status: DC

## 2018-01-16 NOTE — Progress Notes (Signed)
Discharge AVS meds take and those due reviewed with pt. Follow up appointments and when to call MD reviewed. All questions and concerns addressed. No further questions at this time. D/c IV and TELE, CCMD notified. D/C home per orders. Pt brought down via wheelchair with staff and family. Amanda Cockayne, RN

## 2018-01-16 NOTE — Progress Notes (Signed)
CARDIAC REHAB PHASE I   PRE:  Rate/Rhythm: 97 SR  BP:  Supine: 146/75 Sitting:   Standing:    SaO2: 98% RA  MODE:  Ambulation: 470 ft   POST:  Rate/Rhythm: 117 SR  BP:  Supine:   Sitting: 156/82  Standing:    SaO2: 100% RA  [id number] Patient tolerated ambulation well, gait steady independent without c/o. To bed after walk with pacer wires intact. Education completed with patient and patient's husband present including sternal precautions, IS use, restrictions, risk factors, heart healthy diet, and activity progression, pt verbalizes understanding of information given. Pt and husband have watched recovering from [facility] video already. Discussed phase 2 cardiac rehab, and pt is interested in program at [facility], will send referral. Sol Passer, MS, ACSM CEP

## 2018-01-16 NOTE — Plan of Care (Signed)
  Problem: Health Behavior/Discharge Planning: Goal: Ability to manage health-related needs will improve Outcome: Progressing   Problem: Clinical Measurements: Goal: Ability to maintain clinical measurements within normal limits will improve Outcome: Progressing Goal: Will remain free from infection Outcome: Progressing Goal: Diagnostic test results will improve Outcome: Progressing Goal: Cardiovascular complication will be avoided Outcome: Progressing   Problem: Activity: Goal: Risk for activity intolerance will decrease Outcome: Progressing   Problem: Nutrition: Goal: Adequate nutrition will be maintained Outcome: Progressing   Problem: Coping: Goal: Level of anxiety will decrease Outcome: Progressing   Problem: Elimination: Goal: Will not experience complications related to bowel motility Outcome: Progressing Goal: Will not experience complications related to urinary retention Outcome: Progressing   Problem: Pain Managment: Goal: General experience of comfort will improve Outcome: Progressing   Problem: Safety: Goal: Ability to remain free from injury will improve Outcome: Progressing   Problem: Skin Integrity: Goal: Risk for impaired skin integrity will decrease Outcome: Progressing   Problem: Education: Goal: Will demonstrate proper wound care and an understanding of methods to prevent future damage Outcome: Progressing   Problem: Activity: Goal: Risk for activity intolerance will decrease Outcome: Progressing   Problem: Clinical Measurements: Goal: Postoperative complications will be avoided or minimized Outcome: Progressing   Problem: Skin Integrity: Goal: Wound healing without signs and symptoms of infection Outcome: Progressing Goal: Risk for impaired skin integrity will decrease Outcome: Progressing

## 2018-01-16 NOTE — Progress Notes (Addendum)
      [address]       [place] [address]             [phone]      5 Days Post-Op Procedure(s) (LRB): AORTIC VALVE REPLACEMENT (AVR) (N/A) TRANSESOPHAGEAL ECHOCARDIOGRAM (TEE) (N/A) Subjective: No issues overnight. She feels well and is ready for home.   Objective: Vital signs in last 24 hours: Temp:  [98 F (36.7 C)-99.1 F (37.3 C)] 98.9 F (37.2 C) (11/16 0428) Pulse Rate:  [88-97] 92 (11/16 0428) Cardiac Rhythm: Normal sinus rhythm;Heart block (11/15 1900) Resp:  [19-21] 19 (11/16 0428) BP: (116-144)/(69-83) 144/80 (11/16 0428) SpO2:  [94 %-99 %] 94 % (11/16 0428) Weight:  [97.1 kg] 97.1 kg (11/16 0424)     Intake/Output from previous day: 11/15 0701 - 11/16 0700 In: 223 [P.O.:220; I.V.:3] Out: -  Intake/Output this shift: No intake/output data recorded.  General appearance: alert, cooperative and no distress Heart: regular rate and rhythm, S1, S2 normal, no murmur, click, rub or gallop Lungs: clear to auscultation bilaterally Abdomen: soft, non-tender; bowel sounds normal; no masses,  no organomegaly Extremities: extremities normal, atraumatic, no cyanosis or edema Wound: clean and dry  Lab Results: Recent Labs    01/15/18 0222  WBC 9.8  HGB 8.6*  HCT 27.4*  PLT 234   BMET:  Recent Labs    01/15/18 0222  NA 136  K 4.1  CL 103  CO2 26  GLUCOSE 106*  BUN 12  CREATININE 0.86  CALCIUM 8.3*    PT/INR: No results for input(s): LABPROT, INR in the last 72 hours. ABG    Component Value Date/Time   PHART 7.353 01/11/2018 1738   HCO3 21.4 01/11/2018 1738   TCO2 25 01/12/2018 1704   ACIDBASEDEF 4.0 (H) 01/11/2018 1738   O2SAT 96.0 01/11/2018 1738   CBG (last 3)  No results for input(s): GLUCAP in the last 72 hours.  Assessment/Plan: S/P Procedure(s) (LRB): AORTIC VALVE REPLACEMENT (AVR) (N/A) TRANSESOPHAGEAL ECHOCARDIOGRAM (TEE) (N/A)  1. CV-hx of CHB, no in 1st degree AVB. No episode of needing pacer overnight. Will  discontinue EPW. 2. Pulm-tolerating room air with good oxygen saturation 3. Renal-creatinine 0.86, electrolytes okay. Down to her baseline weight 4. H and H stable 8.6/27.4 5. Blood glucose has been well controlled. Not a diabetic.    Plan: Home today 4 hours after wires are pulled. She has follow-up with [place] and cardiology already arranged.    LOS: 5 days    Megan Duke 01/16/2018   Patient without complaints , wants to go home D/c later today  I have seen and examined Megan Duke [id number] and agree with the above assessment  and plan.  Grace Isaac MD Beeper [phone] Office [phone] 01/16/2018 12:31 PM

## 2018-01-16 NOTE — Progress Notes (Signed)
Removed pt EPW with no complications, VS stable,  ends intact. Pt instructed to stay on bedrest x1 hr, pt voiced understanding. Will continue to monitor. Amanda Cockayne, RN

## 2018-01-25 ENCOUNTER — Encounter: Payer: Self-pay | Admitting: *Deleted

## 2018-01-25 ENCOUNTER — Telehealth (HOSPITAL_COMMUNITY): Payer: Self-pay

## 2018-01-25 ENCOUNTER — Ambulatory Visit (INDEPENDENT_AMBULATORY_CARE_PROVIDER_SITE_OTHER): Payer: Self-pay | Admitting: *Deleted

## 2018-01-25 DIAGNOSIS — I350 Nonrheumatic aortic (valve) stenosis: Secondary | ICD-10-CM

## 2018-01-25 DIAGNOSIS — Z952 Presence of prosthetic heart valve: Secondary | ICD-10-CM

## 2018-01-25 DIAGNOSIS — Z4802 Encounter for removal of sutures: Secondary | ICD-10-CM

## 2018-01-25 NOTE — Progress Notes (Signed)
Mrs. Cirillo returns s/p AVR 01/11/18 with discharge on 01/16/18.  She is doing very well. Diet and bowels are good. She is walking while using the cart shopping with her husband.  She is sore but did not fill the Oxycodone script nor is she taking Tylenol.  Her sternal incision and previous chest tube sites are very well healed.  The two chest tube sutures were easily removed. Her only issue is a resting heart rate of around 94.  She sees cardiology on Wednesday and will discuss this with them at that time. She will return as scheduled with a CXR.

## 2018-01-25 NOTE — Telephone Encounter (Signed)
Pt insurance is active and benefits verified through [facility]. Co-pay $0.00, DED $1,250.00/$1,250.00 met, out of pocket $4,890.00/$4,890.00 met, co-insurance 0%. No pre-authorization required. Passport, 01/25/18 @ 10:22AM, REF# [id number]  2ndary insurance is active and benefits verified through [facility]. Co-pay $0.00, DED $900.00/$900.00 met, out of pocket $7,200.00/$1,959.74 met, co-insurance 10%. No pre-authorization required. Passport, 01/25/18 @ 10:26AM, REF# [id number]   Will contact patient to see if she is interested in the Cardiac Rehab Program. If interested, patient will need to complete follow up appt. Once completed, patient will be contacted for scheduling upon review by the RN Navigator.

## 2018-01-26 NOTE — Telephone Encounter (Signed)
Called patient to see if she is interested in the Cardiac Rehab Program. Patient expressed interest. Explained scheduling process and went over insurance, patient verbalized understanding. Will contact patient for scheduling once f/u has been completed. 

## 2018-01-27 ENCOUNTER — Encounter: Payer: Self-pay | Admitting: Physician Assistant

## 2018-01-27 ENCOUNTER — Ambulatory Visit (INDEPENDENT_AMBULATORY_CARE_PROVIDER_SITE_OTHER): Payer: BC Managed Care – PPO | Admitting: Physician Assistant

## 2018-01-27 VITALS — BP 110/72 | HR 89 | Ht 66.0 in | Wt 215.0 lb

## 2018-01-27 DIAGNOSIS — E039 Hypothyroidism, unspecified: Secondary | ICD-10-CM | POA: Diagnosis not present

## 2018-01-27 DIAGNOSIS — D649 Anemia, unspecified: Secondary | ICD-10-CM

## 2018-01-27 DIAGNOSIS — Z952 Presence of prosthetic heart valve: Secondary | ICD-10-CM

## 2018-01-27 DIAGNOSIS — E785 Hyperlipidemia, unspecified: Secondary | ICD-10-CM

## 2018-01-27 DIAGNOSIS — I442 Atrioventricular block, complete: Secondary | ICD-10-CM

## 2018-01-27 NOTE — Progress Notes (Signed)
Cardiology Office Note    Date:  01/29/2018   ID:  Megan Duke 07/10/1955, MRN [id number]  PCP:  Harlan Stains, MD  Cardiologist:  Dr. Claiborne Billings   Chief Complaint  Patient presents with  . Follow-up    seen for Dr. Claiborne Billings.     History of Present Illness:  Megan Duke is a 62 y.o. female with PMH of hyperlipidemia, hypothyroidism, and severe aortic stenosis s/p AVR.  Patient is a Chief Technology Officer.  She has been active all her life.  She had a remote history of significant heart murmur first mentioned when she was evaluated in [place].  Echocardiogram obtained on 09/10/2017 showed normal LV function, mild LVH, grade 1 DD, elevated aortic valve gradient, mild aortic root dilatation.  CT angiogram of the chest demonstrated dilated aortic root measuring 42 mm, 0 coronary calcium score.  Repeat echocardiogram obtained on 12/22/2017 showed preserved LV function, EF 60 to 65%, increased aortic valve gradient when compared to the previous echo.  She was referred for evaluation by Dr. Cyndia Bent and eventually underwent aortic valve replacement using a 27 mm Edwards Inspiris Resilia pericardial valve on 01/11/2018.  Postop course was complicated by complete heart block and she was kept on temporary pacer for several days.  She finally had resumption of sinus rhythm on 01/14/2018.  EKG did show right bundle branch block.  Beta-blocker was not placed due to concern of conduction disease.  Patient presents today for cardiology office visit.  She denies any obvious chest pain shortness of breath.  Her sternotomy scar is well-healed.  She did notice on her Apple Watch that her baseline heart rate is a low elevated compared to prior to the surgery.  However she has not noticed any recurrent bradycardic episode to suggest complete heart block again.  Overall she is doing quite well, on physical exam I do not appreciate any significant aortic valve murmur.  She can follow-up with Dr. Claiborne Billings in  52-month.  I will obtain a CBC to follow-up on postop anemia.   Past Medical History:  Diagnosis Date  . Adenomatous polyp   . Anemia   . Arthritis    fingers  . Dyslipidemia   . Hypothyroidism   . Multinodular goiter   . Non morbid obesity due to excess calories   . Nontoxic goiter   . Pneumonia   . Severe aortic stenosis   . Varicose veins of both lower extremities     Past Surgical History:  Procedure Laterality Date  . AORTIC VALVE REPLACEMENT N/A 01/11/2018   Procedure: AORTIC VALVE REPLACEMENT (AVR);  Surgeon: Gaye Pollack, MD;  Location: [facility];  Service: Open Heart Surgery;  Laterality: N/A;  . COLONOSCOPY    . TEE WITHOUT CARDIOVERSION N/A 01/11/2018   Procedure: TRANSESOPHAGEAL ECHOCARDIOGRAM (TEE);  Surgeon: Gaye Pollack, MD;  Location: [facility];  Service: Open Heart Surgery;  Laterality: N/A;  . WISDOM TOOTH EXTRACTION      Current Medications: Outpatient Medications Prior to Visit  Medication Sig Dispense Refill  . aspirin EC 325 MG EC tablet Take 1 tablet (325 mg total) by mouth daily. 30 tablet 0  . levothyroxine (SYNTHROID, LEVOTHROID) 50 MCG tablet Take 50 mcg by mouth daily before breakfast.      No facility-administered medications prior to visit.      Allergies:   Patient has no known allergies.   Social History   Socioeconomic History  . Marital status: Married    Spouse name: Not  on file  . Number of children: Not on file  . Years of education: Not on file  . Highest education level: Not on file  Occupational History  . Not on file  Social Needs  . Financial resource strain: Not on file  . Food insecurity:    Worry: Not on file    Inability: Not on file  . Transportation needs:    Medical: Not on file    Non-medical: Not on file  Tobacco Use  . Smoking status: Never Smoker  . Smokeless tobacco: Never Used  Substance and Sexual Activity  . Alcohol use: Yes    Comment: occasionally   . Drug use: Never  . Sexual activity: Not on  file  Lifestyle  . Physical activity:    Days per week: Not on file    Minutes per session: Not on file  . Stress: Not on file  Relationships  . Social connections:    Talks on phone: Not on file    Gets together: Not on file    Attends religious service: Not on file    Active member of club or organization: Not on file    Attends meetings of clubs or organizations: Not on file    Relationship status: Not on file  Other Topics Concern  . Not on file  Social History Narrative  . Not on file     Family History:  The patient's family history includes Lung cancer in her mother; Stroke in her paternal grandmother.   ROS:   Please see the history of present illness.    ROS All other systems reviewed and are negative.   PHYSICAL EXAM:   VS:  BP 110/72   Pulse 89   Ht 5\' 6"  (1.676 m)   Wt 215 lb (97.5 kg)   SpO2 95%   BMI 34.70 kg/m    GEN: Well nourished, well developed, in no acute distress  HEENT: normal  Neck: no JVD, carotid bruits, or masses Cardiac: RRR; no murmurs, rubs, or gallops,no edema  Respiratory:  clear to auscultation bilaterally, normal work of breathing GI: soft, nontender, nondistended, + BS MS: no deformity or atrophy  Skin: warm and dry, no rash Neuro:  Alert and Oriented x 3, Strength and sensation are intact Psych: euthymic mood, full affect  Wt Readings from Last 3 Encounters:  01/27/18 215 lb (97.5 kg)  01/16/18 214 lb 1.1 oz (97.1 kg)  01/07/18 217 lb 3.2 oz (98.5 kg)      Studies/Labs Reviewed:   EKG:  EKG is ordered today.  The ekg ordered today demonstrates normal sinus rhythm with right bundle branch block  Recent Labs: 01/07/2018: ALT 11 01/12/2018: Magnesium 2.5 01/15/2018: BUN 12; Creatinine, Ser 0.86; Potassium 4.1; Sodium 136 01/27/2018: Hemoglobin 10.0; Platelets 717   Lipid Panel No results found for: CHOL, TRIG, HDL, CHOLHDL, VLDL, LDLCALC, LDLDIRECT  Additional studies/ records that were reviewed today include:  TEE  01/11/2018 Result status: Final result   Septum: No Patent Foramen Ovale present.  Left atrium: Patent foramen ovale not present.  Aortic valve: The valve is possible bicuspid. Severe valve thickening present. Severe valve calcification present. Severely decreased leaflet separation. Moderate to severe stenosis. Moderate regurgitation.  Aorta: The aortic root is mildly dilated at the sinus of Valsalva. The ascending aorta is mildly dilated.  Mitral valve: No leaflet thickening and calcification present. Mild regurgitation.  Right ventricle: Normal cavity size, wall thickness and ejection fraction. No thrombus present. No mass present. Catheter  present in the ventricle.  Tricuspid valve: Mild regurgitation.  Pulmonic valve: Trace regurgitation.       ASSESSMENT:    1. S/P AVR   2. Postoperative anemia   3. Hyperlipidemia, unspecified hyperlipidemia type   4. Hypothyroidism, unspecified type   5. Complete heart block (HCC)      PLAN:  In order of problems listed above:  1. Severe aortic stenosis s/p bioprosthetic AVR: Stable on physical exam.  No sign of heart failure.  No significant heart murmur.  Patient is currently on high-dose aspirin.  2. Postop anemia: Obtain CBC.  3. Hyperlipidemia: Patient is not currently on any statin.  Annual lipid panel followed by primary care provider.  4. Hypothyroidism: On Synthroid, managed by PCP  5. Complete heart block: Transient complete heart block after aortic valve replacement surgery.  Complete heart block has resolved on postop day #4 and has not recurred since.  Patient has a apple watch which she monitor the heart rate with.  She has not noticed significant bradycardia.    Medication Adjustments/Labs and Tests Ordered: Current medicines are reviewed at length with the patient today.  Concerns regarding medicines are outlined above.  Medication changes, Labs and Tests ordered today are listed in the Patient Instructions  below. Patient Instructions  Medication Instructions:  Your physician recommends that you continue on your current medications as directed. Please refer to the Current Medication list given to you today.  If you need a refill on your cardiac medications before your next appointment, please call your pharmacy.   Lab work: Cbc today If you have labs (blood work) drawn today and your tests are completely normal, you will receive your results only by: Marland Kitchen MyChart Message (if you have MyChart) OR . A paper copy in the mail If you have any lab test that is abnormal or we need to change your treatment, we will call you to review the results.  Testing/Procedures: None ordered  Follow-Up: At [facility], you and your health needs are our priority.  As part of our continuing mission to provide you with exceptional heart care, we have created designated Provider Care Teams.  These Care Teams include your primary Cardiologist (physician) and Advanced Practice Providers (APPs -  Physician Assistants and Nurse Practitioners) who all work together to provide you with the care you need, when you need it. You will need a follow up appointment in 3 months with Dr.Kelly .   You may see  Dr.Kelly or one of the following Advanced Practice Providers on your designated Care Team: Almyra Deforest, [place] . Fabian Sharp, PA-C  Any Other Special Instructions Will Be Listed Below (If Applicable).       Hilbert Corrigan, [place]  01/29/2018 3:43 PM    [facility] [place], [place], [place]  [address] Phone: [phone]; Fax: [phone]

## 2018-01-27 NOTE — Patient Instructions (Signed)
Medication Instructions:  Your physician recommends that you continue on your current medications as directed. Please refer to the Current Medication list given to you today.  If you need a refill on your cardiac medications before your next appointment, please call your pharmacy.   Lab work: Cbc today If you have labs (blood work) drawn today and your tests are completely normal, you will receive your results only by: Marland Kitchen MyChart Message (if you have MyChart) OR . A paper copy in the mail If you have any lab test that is abnormal or we need to change your treatment, we will call you to review the results.  Testing/Procedures: None ordered  Follow-Up: At [facility], you and your health needs are our priority.  As part of our continuing mission to provide you with exceptional heart care, we have created designated Provider Care Teams.  These Care Teams include your primary Cardiologist (physician) and Advanced Practice Providers (APPs -  Physician Assistants and Nurse Practitioners) who all work together to provide you with the care you need, when you need it. You will need a follow up appointment in 3 months with Dr.Kelly .   You may see  Dr.Kelly or one of the following Advanced Practice Providers on your designated Care Team: Almyra Deforest, [place] . Fabian Sharp, PA-C  Any Other Special Instructions Will Be Listed Below (If Applicable).

## 2018-01-28 LAB — CBC WITH DIFFERENTIAL/PLATELET
BASOS: 1 %
Basophils Absolute: 0.1 10*3/uL (ref 0.0–0.2)
EOS (ABSOLUTE): 0.2 10*3/uL (ref 0.0–0.4)
EOS: 2 %
HEMATOCRIT: 30.9 % — AB (ref 34.0–46.6)
Hemoglobin: 10 g/dL — ABNORMAL LOW (ref 11.1–15.9)
IMMATURE GRANS (ABS): 0 10*3/uL (ref 0.0–0.1)
IMMATURE GRANULOCYTES: 0 %
LYMPHS: 28 %
Lymphocytes Absolute: 2.2 10*3/uL (ref 0.7–3.1)
MCH: 28.7 pg (ref 26.6–33.0)
MCHC: 32.4 g/dL (ref 31.5–35.7)
MCV: 89 fL (ref 79–97)
MONOCYTES: 8 %
Monocytes Absolute: 0.7 10*3/uL (ref 0.1–0.9)
NEUTROS PCT: 61 %
Neutrophils Absolute: 4.8 10*3/uL (ref 1.4–7.0)
Platelets: 717 10*3/uL — ABNORMAL HIGH (ref 150–450)
RBC: 3.49 x10E6/uL — ABNORMAL LOW (ref 3.77–5.28)
RDW: 12.8 % (ref 12.3–15.4)
WBC: 8 10*3/uL (ref 3.4–10.8)

## 2018-01-29 ENCOUNTER — Encounter: Payer: Self-pay | Admitting: Physician Assistant

## 2018-02-07 ENCOUNTER — Other Ambulatory Visit: Payer: Self-pay | Admitting: Physician Assistant

## 2018-02-08 NOTE — Telephone Encounter (Signed)
Called pt in regards to CR, pt stated she will not be able to join b/c she will be returning to work soon. Offer pt orientation and to be out on the wait list, pt declined.  Closed referral

## 2018-02-15 ENCOUNTER — Ambulatory Visit (INDEPENDENT_AMBULATORY_CARE_PROVIDER_SITE_OTHER): Payer: Self-pay | Admitting: Physician Assistant

## 2018-02-15 ENCOUNTER — Other Ambulatory Visit: Payer: Self-pay | Admitting: Surgery

## 2018-02-15 ENCOUNTER — Ambulatory Visit
Admission: RE | Admit: 2018-02-15 | Discharge: 2018-02-15 | Disposition: A | Discharge status: Home / Self Care | Payer: BC Managed Care – PPO | Source: Ambulatory Visit | Attending: Surgery | Admitting: Surgery

## 2018-02-15 VITALS — BP 121/79 | HR 96 | Resp 20 | Ht 66.0 in | Wt 216.0 lb

## 2018-02-15 DIAGNOSIS — Z952 Presence of prosthetic heart valve: Secondary | ICD-10-CM

## 2018-02-15 DIAGNOSIS — I350 Nonrheumatic aortic (valve) stenosis: Secondary | ICD-10-CM

## 2018-02-15 NOTE — Progress Notes (Signed)
  HPI: Patient returns for routine postoperative follow-up having undergone elective aortic valve replacement with a 27 Inspiris bovine pericardial tissue valve by Dr. Cyndia Bent on 01/11/18. The patient's early postoperative recovery while in the hospital was notable for 3rd degree heart block for a few days with resolution to NSR. Since hospital discharge the patient reports she has been making a progressive recovery and as no complaints. She denies pain or shortness of breath.   Current Outpatient Medications  Medication Sig Dispense Refill  . aspirin EC 325 MG EC tablet Take 1 tablet (325 mg total) by mouth daily. 30 tablet 0  . levothyroxine (SYNTHROID, LEVOTHROID) 50 MCG tablet Take 50 mcg by mouth daily before breakfast.      No current facility-administered medications for this visit.     Physical Exam: Heart-   RRR, no murmur is heard over the aortic prosthetic valve.  Chest-  Breath sounds CTA, the sternum is stable.  The sternal incision is intact and healing with no sign of complication. Exts-  No significant edema  Diagnostic Tests: CXR obtained today is reviewed and shows no unexpected changes.  The lung fields are clear.  No significant effusions.  Impression / Plan:  Patient is approximately 5 weeks post elective AVR with a 59mm Inspiris bovine pericardial tissue valve for asymptomatic aortic stenosis in a bicuspid aortic valve.  She is making appropriate progress.  Sternal precautions reviewed.  She may resume driving and is encouraged to continue with advancing her activity level as she is doing with her daily walks.  She is given a note to return to work on April 05, 2018.  No further office follow-up is shceduled but she is encouraged to maintain follow up with her cardiologist and primary provider as scheduled.  She is to continue EC aspirin 325mg  op daily.      Antony Odea, PA-C [facility] [phone]

## 2018-05-05 ENCOUNTER — Ambulatory Visit: Payer: BC Managed Care – PPO | Admitting: Cardiovascular Disease

## 2018-06-18 ENCOUNTER — Telehealth: Payer: Self-pay | Admitting: Cardiovascular Disease

## 2018-06-18 NOTE — Telephone Encounter (Signed)
New Message            Patient is going to Virtual video would like the link sent to my chart.

## 2018-06-18 NOTE — Telephone Encounter (Signed)
Called patient, advised that virtual visit link will be sent to cell phone and not mychart.  Patient is aware.  No questions or concerns with this.

## 2018-06-22 ENCOUNTER — Telehealth: Payer: Self-pay | Admitting: Cardiovascular Disease

## 2018-06-22 NOTE — Telephone Encounter (Signed)
Smartphone/ my chart/ virtual consent/ pre reg completed °

## 2018-06-23 ENCOUNTER — Telehealth (INDEPENDENT_AMBULATORY_CARE_PROVIDER_SITE_OTHER): Payer: BC Managed Care – PPO | Admitting: Cardiovascular Disease

## 2018-06-23 ENCOUNTER — Encounter: Payer: Self-pay | Admitting: Cardiovascular Disease

## 2018-06-23 ENCOUNTER — Ambulatory Visit: Payer: BC Managed Care – PPO | Admitting: Cardiovascular Disease

## 2018-06-23 VITALS — BP 117/83 | HR 80 | Ht 66.0 in | Wt 219.0 lb

## 2018-06-23 DIAGNOSIS — I350 Nonrheumatic aortic (valve) stenosis: Secondary | ICD-10-CM

## 2018-06-23 DIAGNOSIS — E039 Hypothyroidism, unspecified: Secondary | ICD-10-CM | POA: Diagnosis not present

## 2018-06-23 DIAGNOSIS — Z952 Presence of prosthetic heart valve: Secondary | ICD-10-CM | POA: Diagnosis not present

## 2018-06-23 DIAGNOSIS — E669 Obesity, unspecified: Secondary | ICD-10-CM | POA: Diagnosis not present

## 2018-06-23 DIAGNOSIS — I442 Atrioventricular block, complete: Secondary | ICD-10-CM

## 2018-06-23 NOTE — Patient Instructions (Addendum)
Medication Instructions:  The current medical regimen is effective;  continue present plan and medications.  If you need a refill on your cardiac medications before your next appointment, please call your pharmacy.   Testing/Procedures: Echocardiogram (6 months) - Your physician has requested that you have an echocardiogram. Echocardiography is a painless test that uses sound waves to create images of your heart. It provides your doctor with information about the size and shape of your heart and how well your heart's chambers and valves are working. This procedure takes approximately one hour. There are no restrictions for this procedure. This will be performed at our [facility] location - [address].  Follow-Up: At [facility], you and your health needs are our priority.  As part of our continuing mission to provide you with exceptional heart care, we have created designated Provider Care Teams.  These Care Teams include your primary Cardiologist (physician) and Advanced Practice Providers (APPs -  Physician Assistants and Nurse Practitioners) who all work together to provide you with the care you need, when you need it. You will need a follow up appointment in 6 months after ECHO.  Please call our office 2 months in advance to schedule this appointment.  You may see Dr.Kelly or one of the following Advanced Practice Providers on your designated Care Team: Almyra Deforest, [place] . Fabian Sharp, PA-C

## 2018-06-23 NOTE — Progress Notes (Signed)
Virtual Visit via Video Note   This visit type was conducted due to national recommendations for restrictions regarding the COVID-19 Pandemic (e.g. social distancing) in an effort to limit this patient's exposure and mitigate transmission in our community.  Due to her co-morbid illnesses, this patient is at least at moderate risk for complications without adequate follow up.  This format is felt to be most appropriate for this patient at this time.  All issues noted in this document were discussed and addressed.  A limited physical exam was performed with this format.  Please refer to the patient's chart for her consent to telehealth for [facility].   Evaluation Performed:  Follow-up visit  Date:  06/23/2018   ID:  Megan, Duke 1956/02/23, MRN [id number]  Patient Location: Home Provider Location: Office  PCP:  Harlan Stains, MD  Cardiologist:  Shelva Majestic, MD Electrophysiologist:  None   Chief Complaint: 6-month follow-up evaluation following aortic valve replacement surgery.  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  I had seen her in 2019 for initial cardiology evaluation at that time she was asymptomatic and was referred to me due to 2/6 systolic murmur radiating to her carotids.  An echo Doppler study in July 2019 showed normal LV function with mild LVH, grade 1 diastolic dysfunction, and significant aortic stenosis with a mean gradient of 40 and peak gradient of 67.  She had mild AR.  Pulmonary pressures were normal.  Time, I had a lengthy discussion with both she and her husband regarding the natural history of aortic valve stenosis in particular with reference to symptom development.  Also discussed potential development of symptoms sooner if there is associated cardia obstructive disease and for this reason recommended that she undergo CT coronary angiogram.  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  Coronary calcium score was 0.  Most vessels had 0 plaque but there was only very minimal nonobstructive irregularities in a diagonal branch of her LAD.  A repeat echo Doppler study in October 2019 suggested progressive aortic valve stenosis.  EF remained normal at 60 to 65% and her mean gradient increased from 40-67 with a peak instantaneous gradient increasing from 51 to 89 mm.  Continue to get the grasp had noticed perhaps slightly more fatigability and that she was not walking quite as fast as she had in the past.  I referred her to Dr. Arvid Right for evaluation.  She ultimately underwent aortic valve replacement by Dr. Mohammed Kindle without having to undergo cardiac catheterization in light of her recent CT angios results.  27 mm Edwards Inspiris Resilia pericardial aortic valve was placed on January 11, 2018.  She tolerated surgery well but her postoperative course was complicated by transient complete heart block for which she was kept on temporary pacemaker for several days.  She ultimately had resumption of normal sinus rhythm and on January 14, 2018.  Her ECG showed right bundle branch block.  She was not laced on beta-blocker due to concern for conduction disease.  Following her surgery, she has been seen by Mammie Russian, PA for initial office visit.  She also has seen Dr. Mohammed Kindle as well as Ellwood Handler, PA-C following her valve surgery.  Presently, she feels improved.  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  She felt that she was starting to gain weight and as result increased her stationary bike routine from 3 days/week to now 7 days/week.  She denies chest pain PND orthopnea.  She denies presyncope or syncope.  She is unaware of any palpitations.  She presents for  evaluation.  The patient does not have symptoms concerning for COVID-19 infection (fever, chills, cough, or new shortness of breath).    Past Medical History:  Diagnosis Date   Adenomatous polyp    Anemia    Arthritis    fingers   Dyslipidemia    Hypothyroidism    Multinodular goiter    Non morbid obesity due to excess calories    Nontoxic goiter    Pneumonia    Severe aortic stenosis    Varicose veins of both lower extremities    Past Surgical History:  Procedure Laterality Date   AORTIC VALVE REPLACEMENT N/A 01/11/2018   Procedure: AORTIC VALVE REPLACEMENT (AVR);  Surgeon: Gaye Pollack, MD;  Location: [facility];  Service: Open Heart Surgery;  Laterality: N/A;   COLONOSCOPY     TEE WITHOUT CARDIOVERSION N/A 01/11/2018   Procedure: TRANSESOPHAGEAL ECHOCARDIOGRAM (TEE);  Surgeon: Gaye Pollack, MD;  Location: [place];  Service: Open Heart Surgery;  Laterality: N/A;   WISDOM TOOTH EXTRACTION       Current Meds  Medication Sig   aspirin EC 325 MG EC tablet Take 1 tablet (325 mg total) by mouth daily.   levothyroxine (SYNTHROID, LEVOTHROID) 50 MCG tablet Take 50 mcg by mouth daily before breakfast.      Allergies:   Patient has no known allergies.   Social History   Tobacco Use   Smoking status: Never Smoker   Smokeless tobacco: Never Used  Substance Use Topics   Alcohol use: Yes    Comment: occasionally    Drug use: Never     Family Hx: The patient's family history includes Lung cancer in her mother; Stroke in her paternal grandmother.  ROS:   General: Negative; No fevers, chills, or night sweats;  HEENT: Negative; No changes in vision or hearing, sinus congestion, difficulty swallowing Pulmonary: Negative; No cough, wheezing, shortness of breath, hemoptysis Cardiovascular: see HPI;  No chest pain, presyncope, syncope, palpitations GI: Negative; No nausea, vomiting, diarrhea, or abdominal pain GU: Negative; No dysuria, hematuria, or  difficulty voiding Musculoskeletal: Negative; no myalgias, joint pain, or weakness Hematologic/Oncology: Negative; no easy bruising, bleeding Endocrine: Negative; no heat/cold intolerance; no diabetes Neuro: Negative; no changes in balance, headaches Skin: Negative; No rashes or skin lesions Psychiatric: Negative; No behavioral problems, depression Sleep: Negative; No snoring, daytime sleepiness, hypersomnolence, bruxism, restless legs, hypnogognic hallucinations, no cataplexy  All other systems reviewed and are negative.   Prior CV studies:   The following studies were reviewed today:  ------------------------------------------------------------------- ECHO Study Conclusions: 12/22/2017  - Left ventricle: The cavity size was normal. Wall thickness was   increased in a pattern of mild LVH. There was focal basal   hypertrophy. Systolic function was normal. The estimated ejection   fraction was in the range of 60% to 65%. Wall motion was normal;   there were no regional wall motion abnormalities. Features are   consistent with a pseudonormal left ventricular filling pattern,   with concomitant abnormal relaxation and increased filling   pressure (grade 2 diastolic dysfunction). - Aortic valve: Mildly to moderately calcified  annulus. Moderately   thickened, moderately calcified leaflets. There was severe   stenosis. There was mild to moderate regurgitation. - Mitral valve: There was mild regurgitation.  Labs/Other Tests and Data Reviewed:    EKG:  An ECG dated 12/24/2017 was personally reviewed today and demonstrated:   Normal sinus rhythm at 73 bpm.  Right bundle branch block with repolarization changes.  Normal intervals.  No ectopy.  Recent Labs: 01/07/2018: ALT 11 01/12/2018: Magnesium 2.5 01/15/2018: BUN 12; Creatinine, Ser 0.86; Potassium 4.1; Sodium 136 01/27/2018: Hemoglobin 10.0; Platelets 717   Recent Lipid Panel No results found for: CHOL, TRIG, HDL, CHOLHDL,  LDLCALC, LDLDIRECT  Wt Readings from Last 3 Encounters:  06/23/18 219 lb (99.3 kg)  02/15/18 216 lb (98 kg)  01/27/18 215 lb (97.5 kg)     Objective:    Vital Signs:  BP 117/83    Pulse 80    Ht 5\' 6"  (1.676 m)    Wt 219 lb (99.3 kg)    BMI 35.35 kg/m    She is well-developed in no acute distress Breathing is normal and not labored HEENT is unremarkable There is no obvious JVD. There is no audible wheezing She admits to well healing of her sternal scar.  She denies tenderness to palpation when press pressing on her chest Her abdomen is nontender Denies any leg swelling. Neurologically she is intact She has normal cognition and affect  ASSESSMENT & PLAN:    1. Severe aortic stenosis/status post AVR:  Ms. Cordelia Pen underwent successful bioprosthetic aortic valve replacement with a 27 mm Inspiris bovine pericardial tissue valve by Dr. Cyndia Bent on 01/11/18. Her initial early postoperative course was notable for transient third-degree heart block for several days with ultimate resolution back to normal sinus rhythm.  Her previous fatigability has improved.  She remains asymptomatic with reference to chest pain, PND orthopnea, presyncope or syncope, or dyspnea. 2. 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 3. Hypothyroidism.  She continues to be on levothyroxine 50 mcg daily. 4. Patient third-degree heart block postoperatively: Resolved.  She states her heart rate now is running in the 70s to low 80s.  Shortly after surgery her heart rate was in the 90s but this has improved with her increasing exercise. 5. Mild obesity: We discussed the importance of weight loss and continued exercise.  Ms. Cordelia Pen will be seeing her primary physician, Dr. Harlan Stains in July.  She will undergo  complete set of laboratory at that time.  I have recommended that these be forwarded to me for my review. We will plan to see her in 6 months for follow-up office visit and prior to that evaluation I am recommending she undergo a 1 year echo Doppler study following her aortic valve replacement surgery.  COVID-19 Education: The signs and symptoms of COVID-19 were discussed with the patient and how to seek care for testing (follow up with PCP or arrange E-visit).  The importance of social distancing was discussed today.  Time:   Today, I have spent 28 minutes with the patient with telehealth technology discussing the above problems.     Medication Adjustments/Labs and Tests Ordered: Current medicines are reviewed at length with the patient today.  Concerns regarding medicines are outlined above.   Tests Ordered: No orders of the defined  types were placed in this encounter.   Medication Changes: No orders of the defined types were placed in this encounter.   Disposition:  Follow up 6 months  Signed, Shelva Majestic, MD  06/23/2018 11:39 AM    [place]

## 2019-01-14 ENCOUNTER — Ambulatory Visit (HOSPITAL_COMMUNITY): Payer: BC Managed Care – PPO | Attending: Cardiovascular Disease

## 2019-01-14 ENCOUNTER — Other Ambulatory Visit: Payer: Self-pay

## 2019-01-14 DIAGNOSIS — Z952 Presence of prosthetic heart valve: Secondary | ICD-10-CM | POA: Diagnosis not present

## 2019-02-01 ENCOUNTER — Encounter: Payer: Self-pay | Admitting: Cardiovascular Disease

## 2019-02-01 ENCOUNTER — Other Ambulatory Visit: Payer: Self-pay

## 2019-02-01 ENCOUNTER — Ambulatory Visit (INDEPENDENT_AMBULATORY_CARE_PROVIDER_SITE_OTHER): Payer: BC Managed Care – PPO | Admitting: Cardiovascular Disease

## 2019-02-01 VITALS — BP 134/84 | HR 76 | Ht 66.0 in | Wt 232.0 lb

## 2019-02-01 DIAGNOSIS — I350 Nonrheumatic aortic (valve) stenosis: Secondary | ICD-10-CM

## 2019-02-01 DIAGNOSIS — E7800 Pure hypercholesterolemia, unspecified: Secondary | ICD-10-CM

## 2019-02-01 DIAGNOSIS — Z952 Presence of prosthetic heart valve: Secondary | ICD-10-CM

## 2019-02-01 DIAGNOSIS — E669 Obesity, unspecified: Secondary | ICD-10-CM

## 2019-02-01 DIAGNOSIS — E039 Hypothyroidism, unspecified: Secondary | ICD-10-CM | POA: Diagnosis not present

## 2019-02-01 MED ORDER — ROSUVASTATIN CALCIUM 10 MG PO TABS: 10.0000 mg | ORAL_TABLET | Freq: Every day | ORAL | 3 refills | Status: DC

## 2019-02-01 NOTE — Progress Notes (Signed)
Cardiology Office Note    Date:  02/01/2019   ID:  Megan Duke 01-29-1956, MRN [id number]  PCP:  Megan Stains, MD  Cardiologist:  Megan Majestic, MD    History of Present Illness:  Megan Duke is a 63 y.o. female presents for an 8 month cardiology follow-up evaluation.  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  She denied any awareness of a cardiac murmur until approximately 2018 which was initially detected in [place]. I had seen her in July 2019 for initial cardiology evaluation at that time she was asymptomatic and was referred to me due to 2/6 systolic murmur radiating to her carotids.  An echo Doppler study in July 2019 showed normal LV function with mild LVH, grade 1 diastolic dysfunction, and significant aortic stenosis with a mean gradient of 40 and peak gradient of 67.  She had mild AR.  Pulmonary pressures were normal.  At that I had a lengthy discussion with both she and her husband regarding the natural history of aortic valve stenosis in particular with reference to symptom development.  I discussed potential development of symptoms sooner if there is associated coronary obstructive disease and for this reason recommended that she undergo CT coronary angiogram.  CT angios demonstrated mild dilation of aortic root at the sinus level (42 mm and mild ascending aortic aneurysm at 41 mm.  Coronary calcium score was 0.  Most vessels had 0 plaque but there was only very minimal nonobstructive irregularities in a diagonal branch of her LAD.  A repeat echo Doppler study in October 2019 suggested progressive aortic valve stenosis.  EF remained normal at 60 to 65% and her mean gradient increased from 40-67 with a peak instantaneous gradient increasing from 51 to 89 mm.    I saw her for follow-up evaluation in October 2019 that time she  noticed perhaps slightly more fatigability and that she was not walking quite as fast as she had in  the past.  I referred her to Dr. Arvid Duke for evaluation.  She ultimately underwent aortic valve replacement by Dr. Cyndia Duke without having to undergo cardiac catheterization in light of her recent CT angios results.  A 27 mm Edwards Inspiris Resilia pericardial aortic valve was placed on January 11, 2018.  She tolerated surgery well but her postoperative course was complicated by transient complete heart block for which she was kept on temporary pacemaker for several days.  She ultimately had resumption of normal sinus rhythm and on January 14, 2018.  Her ECG showed Duke bundle branch block.  She was not started on beta-blocker due to concern for conduction disease.  Following her surgery, she was seen by Megan Deforest, PA for initial office visit.  She also has seen Dr. Cyndia Duke as well as Megan Handler, PA-C following her valve surgery.  In April 2020 I evaluated her in a telemedicine visit due to the COVID-19 pandemic.  At the time she felt significantly improved.  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  She had noticed that she was gaining weight subsequently increased riding a stationary bike from 3 days/week to 7 days/week.  Since I last saw her, she has now been going to school but is teaching remotely from her classroom.  Her energy level has significantly improved.  She denies chest pain.  She denies palpitations.  She denies exertional dyspnea.  She is exercising daily with a stationary bike, walking  or rowing for at least 30 minutes.  She had undergone laboratory by Dr. Harlan Duke in July which showed a total cholesterol of 237, HDL 53, LDL 155, triglycerides 145.  Prior lipid studies dating back to 2017 from Dr. Dema Duke has shown an LDL cholesterol at 164 in 2017, 159 in 2018, and had reduced to 126 in July 2019.  In the past I had had discussion with her concerning initiation of statin therapy but she had deferred treatment.  She underwent a 1 year  follow-up echo Doppler study on January 14, 2019 which revealed normal LV function with EF 55 to 60% with borderline LVH.  She had abnormal septal motion consistent with postoperative status.  The 27 mm Edwards Inspira wrist wrist cilia pericardial valve was normal in function.  Mean gradient 14.  There was no evidence for aortic regurgitation.  She presents for follow-up evaluation   Past Medical History:  Diagnosis Date  . Adenomatous polyp   . Anemia   . Arthritis    fingers  . Dyslipidemia   . Hypothyroidism   . Multinodular goiter   . Non morbid obesity due to excess calories   . Nontoxic goiter   . Pneumonia   . Severe aortic stenosis   . Varicose veins of both lower extremities     Past Surgical History:  Procedure Laterality Date  . AORTIC VALVE REPLACEMENT N/A 01/11/2018   Procedure: AORTIC VALVE REPLACEMENT (AVR);  Surgeon: Megan Pollack, MD;  Location: [place];  Service: Open Heart Surgery;  Laterality: N/A;  . COLONOSCOPY    . TEE WITHOUT CARDIOVERSION N/A 01/11/2018   Procedure: TRANSESOPHAGEAL ECHOCARDIOGRAM (TEE);  Surgeon: Megan Pollack, MD;  Location: [place];  Service: Open Heart Surgery;  Laterality: N/A;  . WISDOM TOOTH EXTRACTION      Current Medications: Outpatient Medications Prior to Visit  Medication Sig Dispense Refill  . aspirin 81 MG chewable tablet Chew by mouth daily.    Marland Kitchen levothyroxine (SYNTHROID, LEVOTHROID) 50 MCG tablet Take 50 mcg by mouth daily before breakfast.     . aspirin EC 325 MG EC tablet Take 1 tablet (325 mg total) by mouth daily. 30 tablet 0   No facility-administered medications prior to visit.      Allergies:   Patient has no known allergies.   Social History   Socioeconomic History  . Marital status: Married    Spouse name: Not on file  . Number of children: Not on file  . Years of education: Not on file  . Highest education level: Not on file  Occupational History  . Not on file  Social Needs  . Financial resource  strain: Not on file  . Food insecurity    Worry: Not on file    Inability: Not on file  . Transportation needs    Medical: Not on file    Non-medical: Not on file  Tobacco Use  . Smoking status: Never Smoker  . Smokeless tobacco: Never Used  Substance and Sexual Activity  . Alcohol use: Yes    Comment: occasionally   . Drug use: Never  . Sexual activity: Not on file  Lifestyle  . Physical activity    Days per week: Not on file    Minutes per session: Not on file  . Stress: Not on file  Relationships  . Social Herbalist on phone: Not on file    Gets together: Not on file    Attends religious service:  Not on file    Active member of club or organization: Not on file    Attends meetings of clubs or organizations: Not on file    Relationship status: Not on file  Other Topics Concern  . Not on file  Social History Narrative  . Not on file    Additional social history is notable that she was born in [place].  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  She attended [employer] [place] for college.  Family History:  The patient's family history includes Lung cancer in her mother; Stroke in her paternal grandmother.   Her mother died at age 36 with lung cancer.  Father is living at age 52.  She has a brother at age 31, and 2 sisters ages 67 and 50.  The patient's family history includes Lung cancer in her mother; Stroke in her paternal grandmother.  She has 3 children ages 66, 13, and 43   ROS General: Negative; No fevers, chills, or night sweats;  HEENT: Negative; No changes in vision or hearing, sinus congestion, difficulty swallowing Pulmonary: Negative; No cough, wheezing, shortness of breath, hemoptysis Cardiovascular:see HPI GI ; no nausea, vomiting, diarrhea, or abdominal pain GU: Negative; No dysuria, hematuria, or difficulty voiding Musculoskeletal: Negative; no myalgias, joint pain, or weakness Hematologic/Oncology: Negative; no easy  bruising, bleeding Endocrine: Negative; no heat/cold intolerance; no diabetes Neuro: Negative; no changes in balance, headaches Skin: Negative; No rashes or skin lesions Psychiatric: Negative; No behavioral problems, depression Sleep: Negative; No snoring, daytime sleepiness, hypersomnolence, bruxism, restless legs, hypnogognic hallucinations, no cataplexy Other comprehensive 14 point system review is negative.   PHYSICAL EXAM:   VS:  BP 134/84   Pulse 76   Ht '5\' 6"'$  (1.676 m)   Wt 232 lb (105.2 kg)   SpO2 99%   BMI 37.45 kg/m     Repeat blood pressure by me 136/80  Wt Readings from Last 3 Encounters:  02/01/19 232 lb (105.2 kg)  06/23/18 219 lb (99.3 kg)  02/15/18 216 lb (98 kg)    General: Alert, oriented, no distress.  Moderate obesity Skin: normal turgor, no rashes, warm and dry HEENT: Normocephalic, atraumatic. Pupils equal round and reactive to light; sclera anicteric; extraocular muscles intact;  Nose without nasal septal hypertrophy Mouth/Parynx benign; Mallinpatti scale 3 Neck: No JVD, no carotid bruits; normal carotid upstroke Lungs: clear to ausculatation and percussion; no wheezing or rales Chest wall: without tenderness to palpitation Heart: PMI not displaced, RRR, s1 s2 normal, 8-7/5 systolic murmur c/w with her AVR, no diastolic murmur, no rubs, gallops, thrills, or heaves Abdomen: soft, nontender; no hepatosplenomehaly, BS+; abdominal aorta nontender and not dilated by palpation. Back: no CVA tenderness Pulses 2+ Musculoskeletal: full range of motion, normal strength, no joint deformities Extremities: no clubbing cyanosis or edema, Homan's sign negative  Neurologic: grossly nonfocal; Cranial nerves grossly wnl Psychologic: Normal mood and affect   Studies/Labs Reviewed:   EKG:  EKG is  ordered today.  ECG (independently read by me): Normal sinus rhythm at 76 bpm.  First-degree AV block with a parable to 10 ms.  No ectopy.  Normal intervals.  October  24/2019 ECG (independently read by me): Normal sinus rhythm at 73 bpm.  Duke bundle branch block with repolarization changes.  Normal intervals.  No ectopy.  September 21, 2017 ECG (independently read by me): Sinus rhythm at 80 bpm.  Duke bundle branch block with repolarization changes.  No evidence for LVH or LV strain.  Recent Labs: BMP Latest Ref Rng & Units 01/15/2018 01/13/2018 01/12/2018  Glucose 70 - 99 mg/dL 106(H) 101(H) 120(H)  BUN 6 - 20 mg/dL '12 12 15  '$ Creatinine 0.44 - 1.00 mg/dL 0.86 0.83 0.70  BUN/Creat Ratio 12 - 28 - - -  Sodium 135 - 145 mmol/L 136 133(L) 135  Potassium 3.5 - 5.1 mmol/L 4.1 4.0 4.2  Chloride 98 - 111 mmol/L 103 105 102  CO2 22 - 32 mmol/L 26 23 -  Calcium 8.9 - 10.3 mg/dL 8.3(L) 8.1(L) -     Hepatic Function Latest Ref Rng & Units 01/07/2018  Total Protein 6.5 - 8.1 g/dL 7.1  Albumin 3.5 - 5.0 g/dL 3.9  AST 15 - 41 U/L 17  ALT 0 - 44 U/L 11  Alk Phosphatase 38 - 126 U/L 61  Total Bilirubin 0.3 - 1.2 mg/dL 0.8    CBC Latest Ref Rng & Units 01/27/2018 01/15/2018 01/13/2018  WBC 3.4 - 10.8 x10E3/uL 8.0 9.8 12.1(H)  Hemoglobin 11.1 - 15.9 g/dL 10.0(L) 8.6(L) 8.9(L)  Hematocrit 34.0 - 46.6 % 30.9(L) 27.4(L) 28.1(L)  Platelets 150 - 450 x10E3/uL 717(H) 234 155   Lab Results  Component Value Date   MCV 89 01/27/2018   MCV 90.7 01/15/2018   MCV 91.8 01/13/2018   No results found for: TSH Lab Results  Component Value Date   HGBA1C 5.1 01/07/2018     BNP No results found for: BNP  ProBNP No results found for: PROBNP   Lipid Panel  No results found for: CHOL, TRIG, HDL, CHOLHDL, VLDL, LDLCALC, LDLDIRECT, LABVLDL    Recent lipid panel from Dr. Harlan Duke on September 15, 2018: Total cholesterol 237, HDL 53, LDL 155, triglycerides 145.  RADIOLOGY: No results found.   Additional studies/ records that were reviewed today include:  09/10/2017 ECHO Study Conclusions  - Left ventricle: The cavity size was normal. Wall thickness was  increased in a pattern of mild LVH. Systolic function was normal. The estimated ejection fraction was in the range of 55% to 60%. Wall motion was normal; there were no regional wall motion abnormalities. Doppler parameters are consistent with abnormal left ventricular relaxation (grade 1 diastolic dysfunction). - Aortic valve: Valve mobility was restricted. There was severe stenosis. There was mild regurgitation. - Aortic root: The aortic root was mildly dilated. - Ascending aorta: The ascending aorta was mildly dilated. - Mitral valve: There was mild regurgitation.  Impressions:  - Normal LV systolic function; mild diastolic dysfunction; mild LVH; calcified aortic valve with severe AS (mean gradient 40 mmHg) and mild AI; mildly dilated aortic root/ascending aorta; mild MR.  ________________________________________________________ October 14, 2017 CT CORONARY ANGIO CLINICAL DATA: 63 year old female with severe aortic stenosis being evaluated for AVR.  EXAM: Cardiac TAVR CT  TECHNIQUE: The patient was scanned on a [employer]. A 120 kV retrospective scan was triggered in the descending thoracic aorta at 111 HU's. Gantry rotation speed was 250 msecs and collimation was .6 mm. No beta blockade or nitro were given. The 3D data set was reconstructed in 5% intervals of the R-R cycle. Systolic and diastolic phases were analyzed on a dedicated work station using MPR, MIP and VRT modes. The patient received 80 cc of contrast.  FINDINGS: Aortic Valve: Functionally bicuspid aortic valve with co-joined left and Duke coronary leaflets. Leaflets are severely thickened and calcified with severely restricted leaflet opening in systole. Aortic valve is dilated at the sinus level with maximum diameter 42 mm.  Aorta: Dilated aortic  root at the sinus level (42 mm), sinotubular junction (38 mm) and mild ascending aortic aneurysm (41 mm). Normal size of  the aortic arch and descending aorta. Mild diffuse atheroma and calcifications. No dissection.  Sinotubular Junction: 38 x 36 mm  Ascending Thoracic Aorta: 41 x 41 mm  Aortic Arch: 28 x 25 mm  Descending Thoracic Aorta: 23 x 22 mm  Sinus of Valsalva Measurements:  Non-coronary: 41 mm  Duke -coronary: 37 mm  Left -coronary:  Coronary Artery Height above Annulus:  Left Main: 14 mm  Duke Coronary: 19 mm  Virtual Basal Annulus Measurements:  Maximum/Minimum Diameter: 33.6 x 27.8 mm  Mean Diameter: 29.8 mm  Perimeter: 98.1 mm  Area: 697 mm2  Coronary Arteries: Normal coronary origin. Duke dominance. Calcium score is 0.  RCA is a large artery that gives rise to PDA and PLA. There is no plaque.  Left main gives rise to LAD and LCX arteries and has no plaque.  LAD gives rise to one diagonal artery and has minimal non-obstructive irregularities.  LCX is a non-dominant artery that gives rise to one OM branch and has no plaque.  IMPRESSION: 1. Functionally bicuspid aortic valve with co-joined left and Duke coronary leaflets. Leaflets are severely thickened and calcified with severely restricted leaflet opening in systole.  2. 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  3. Normal coronary origin. Duke dominance. Calcium score is 0. No CAD.  4. No thrombus in the left atrial appendage.   ------------------------------------------------------------------- 12/22/2017 ECHO Study Conclusions  - Left ventricle: The cavity size was normal. Wall thickness was increased in a pattern of mild LVH. There was focal basal hypertrophy. Systolic function was normal. The estimated ejection fraction was in the range of 60% to 65%. Wall motion was normal; there were no regional wall motion abnormalities. Features are consistent with a pseudonormal left  ventricular filling pattern, with concomitant abnormal relaxation and increased filling pressure (grade 2 diastolic dysfunction). - Aortic valve: Mildly to moderately calcified annulus. Moderately thickened, moderately calcified leaflets. There was severe stenosis. There was mild to moderate regurgitation. - Mitral valve: There was mild regurgitation.  ____________________________________________________________________________  ECHO 01/14/2019 IMPRESSIONS   1. 27 mm Edwards Inspiris Resilia pericardial valve implanted 01/11/2018. Normal functioning bioprosthetic valve. Vmax 2.5 m/s, mean gradient 14 mmHG, DI 0.40, AVA 1.68 cm2 by VTI. No evidence of regurgitation.  2. Left ventricular ejection fraction, by visual estimation, is 55 to 60%. The left ventricle has normal function. There is borderline left ventricular hypertrophy.  3. Abnormal septal motion consistent with post-operative status.  4. Global Duke ventricle has normal systolic function.The Duke ventricular size is normal. No increase in Duke ventricular wall thickness.  5. Left atrial size was normal.  6. Duke atrial size was normal.  7. Trivial pericardial effusion is present.  8. The mitral valve is grossly normal. Trace mitral valve regurgitation.  9. The tricuspid valve is grossly normal. Tricuspid valve regurgitation is mild. 10. Aortic valve regurgitation is not visualized. 11. The pulmonic valve was grossly normal. Pulmonic valve regurgitation is trivial. 12. There is mild dilatation of the ascending aorta measuring 41 mm. 13. Normal pulmonary artery systolic pressure. 14. The inferior vena cava is normal in size with greater than 50% respiratory variability, suggesting Duke atrial pressure of 3 mmHg. 15. The tricuspid regurgitant velocity is 2.40 m/s, and with an assumed Duke atrial pressure of 3 mmHg, the estimated Duke ventricular  systolic pressure is normal at 25.9 mmHg.    ASSESSMENT:    1.  Severe aortic stenosis   2. 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 Pure hypercholesterolemia   4. Hypothyroidism, unspecified type   5. Mild obesity     PLAN:  Ms. Kahlia Lagunes is a very pleasant 63 year old female who was initially noted to have a cardiac murmur several years ago.  I had seen her initially in July 2019 after an echo Doppler study demonstrated severe aortic stenosis.  At that time she was completely asymptomatic but currently developed fatigability and slight decline in exercise tolerance.  She ultimately underwent successful aortic valve replacement on January 11, 2018 teen by Dr. Caffie Pinto and had a 27 mm Edwards Inspra wrist wrist cilia pericardial valve implanted.  1 year follow-up echo Doppler study shows normal functioning bioprosthetic valve with a mean gradient of 14 and aortic valve area of 1.68 cm without evidence for aortic insufficiency.  She has normal LV systolic function with EF 55 to 60%.  Reviewed recent laboratory from Dr. Dema Duke in reviewed her lipid studies dating back through 2017 done by Dr. Dema Duke.  She has consistently had elevated LDL levels.  I discussed with her data regarding subclinical atherosclerosis.  Fortunately she did not have significant plaque noted on her CTA 1 year ago.  However I discussed with her with her LDL at 155 treatment of her hyperlipidemia.  She states that she does not eat a high cholesterol diet.  She has a history of hypothyroidism and is on levothyroxine replacement.  She had read that thyroid medication can cause her cholesterol to increase.  I discussed with her that hypothyroidism can be associated with hyperlipidemia not levothyroxine and on therapy he is euthyroid and her TSH is normal at 1.44 and TSH is normal.  Initially she was very resident to consider treatment of her hyperlipidemia as she had been at past.  However after much discussion she is agreed to a trial of rosuvastatin 10 mg.  She will start 10 mg 2 days/week for 2  weeks, increase this to 3 days/week for 2 weeks, then increase to every other day and if tolerated to daily.  In 3 to 4 months I will repeat a fasting lipid panel and chemistry profile.  I discussed alternatives to statin such as Zetia and bempedoic acid.  I will see her in 4 months for reevaluation.   Medication Adjustments/Labs and Tests Ordered: Current medicines are reviewed at length with the patient today.  Concerns regarding medicines are outlined above.  Medication changes, Labs and Tests ordered today are listed in the Patient Instructions below. Patient Instructions  Medication Instructions:  Start Crestor 10 mg   Twice a week for 2 weeks then Three times a week for 2 weeks then take every other day for 2 weeks then take every day  Continue all other medications  Lab Work: Fasting Lipid and Hepatic panels in 3 to 4 months    Lab order enclosed   Testing/Procedures: None ordered  Follow-Up: At [facility], you and your health needs are our priority.  As part of our continuing mission to provide you with exceptional heart care, we have created designated Provider Care Teams.  These Care Teams include your primary Cardiologist (physician) and Advanced Practice Providers (APPs -  Physician Assistants and Nurse Practitioners) who all work together to provide you with the care you need, when you need it.  Your next appointment:  4 months   Call  in Feb to schedule April appointment   The format for your next appointment:  Office   Provider:  [facility]      Signed, Megan Majestic, MD  02/01/2019 6:48 PM    [facility] [address] [place], [place], [place]  [address] Phone: [phone]

## 2019-02-01 NOTE — Patient Instructions (Signed)
Medication Instructions:  Start Crestor 10 mg   Twice a week for 2 weeks then Three times a week for 2 weeks then take every other day for 2 weeks then take every day  Continue all other medications  Lab Work: Fasting Lipid and Hepatic panels in 3 to 4 months    Lab order enclosed   Testing/Procedures: None ordered  Follow-Up: At [facility], you and your health needs are our priority.  As part of our continuing mission to provide you with exceptional heart care, we have created designated Provider Care Teams.  These Care Teams include your primary Cardiologist (physician) and Advanced Practice Providers (APPs -  Physician Assistants and Nurse Practitioners) who all work together to provide you with the care you need, when you need it.  Your next appointment:  4 months   Call in Feb to schedule April appointment   The format for your next appointment:  Office   Provider:  [facility]

## 2019-09-15 ENCOUNTER — Telehealth: Payer: Self-pay | Admitting: Cardiovascular Disease

## 2019-09-15 NOTE — Telephone Encounter (Signed)
Patient would like to switch providers from Dr. Claiborne Billings to Dr. Oval Linsey. Are you both in agreement?

## 2019-09-20 NOTE — Telephone Encounter (Signed)
ok 

## 2019-09-20 NOTE — Telephone Encounter (Signed)
OK with me.

## 2019-11-23 ENCOUNTER — Encounter: Payer: Self-pay | Admitting: Cardiovascular Disease

## 2019-11-23 ENCOUNTER — Ambulatory Visit (INDEPENDENT_AMBULATORY_CARE_PROVIDER_SITE_OTHER): Payer: BC Managed Care – PPO | Admitting: Cardiovascular Disease

## 2019-11-23 ENCOUNTER — Other Ambulatory Visit: Payer: Self-pay

## 2019-11-23 VITALS — BP 124/76 | HR 81 | Ht 66.0 in | Wt 234.2 lb

## 2019-11-23 DIAGNOSIS — E7800 Pure hypercholesterolemia, unspecified: Secondary | ICD-10-CM

## 2019-11-23 DIAGNOSIS — E669 Obesity, unspecified: Secondary | ICD-10-CM | POA: Diagnosis not present

## 2019-11-23 DIAGNOSIS — I350 Nonrheumatic aortic (valve) stenosis: Secondary | ICD-10-CM

## 2019-11-23 DIAGNOSIS — Z952 Presence of prosthetic heart valve: Secondary | ICD-10-CM | POA: Diagnosis not present

## 2019-11-23 HISTORY — DX: Pure hypercholesterolemia, unspecified: E78.00

## 2019-11-23 HISTORY — DX: Obesity, unspecified: E66.9

## 2019-11-23 NOTE — Progress Notes (Signed)
Cardiology Office Note   Date:  11/23/2019   ID:  Megan Duke 1955-05-22, MRN [id number]  PCP:  Harlan Stains, MD  Cardiologist:   Skeet Latch, MD   No chief complaint on file.    History of Present Illness: Megan Duke is a 64 y.o. female with bicuspid aortic valve status post aortic valve replacement, hyperlipidemia, morbid obesity, and hypothyroidism here to establish care.  She was noted to have a systolic murmur in 4627.  Echo at that time revealed LVEF 55 to 60% with grade 1 diastolic dysfunction and severe aortic stenosis.  She also had a mild ascending aortic aneurysm.  Mean gradient at that time was 40 mmHg.  She was asymptomatic.  She had a coronary CT-a that revealed no coronary artery disease and her ascending aortic aneurysm was 4.2 cm.  Repeat echo 12/2017 showed an increase in her mean aortic valve gradient from 40 to 67 mmHg.  She was ultimately referred for aortic valve replacement at [facility] with Dr. Cyndia Bent.  She had a 27 mm Edwards Inspiris Resilia pericardial aortic valve placed 01/2018.  Postoperative course was complicated by complete heart block and temporary pacemaker but no permanent device was needed.  Repeat echo 01/2019 revealed LVEF 55 to 60% with a mean gradient of 14 mmHg.  Megan Duke reports feeling well.  She works as a fourth Engineer, water and by the end of the day she is very tired.  Before school started she has been getting more exercise.  Lately she has been trying to walk approximately twice per week.  She denies any lower extremity edema, orthopnea, or PND.  She also has not had any chest pain or pressure.  She occasionally gets palpitations when laying down but denies lightheadedness or dizziness.  Given her hyperlipidemia was recommended that she start rosuvastatin.  She did take it for a while but felt mental fogginess and discontinued it.  She would prefer to work on diet and exercise.  She tries to improve her  diet by limiting red meat intake.  She struggles with living better and cheese.   Past Medical History:  Diagnosis Date  . Adenomatous polyp   . Anemia   . Arthritis    fingers  . Dyslipidemia   . Hypothyroidism   . Multinodular goiter   . Non morbid obesity due to excess calories   . Nontoxic goiter   . Obesity (BMI 30-39.9) 11/23/2019  . Pneumonia   . Pure hypercholesterolemia 11/23/2019  . Severe aortic stenosis   . Varicose veins of both lower extremities     Past Surgical History:  Procedure Laterality Date  . AORTIC VALVE REPLACEMENT N/A 01/11/2018   Procedure: AORTIC VALVE REPLACEMENT (AVR);  Surgeon: Gaye Pollack, MD;  Location: [place];  Service: Open Heart Surgery;  Laterality: N/A;  . COLONOSCOPY    . TEE WITHOUT CARDIOVERSION N/A 01/11/2018   Procedure: TRANSESOPHAGEAL ECHOCARDIOGRAM (TEE);  Surgeon: Gaye Pollack, MD;  Location: [place];  Service: Open Heart Surgery;  Laterality: N/A;  . WISDOM TOOTH EXTRACTION       Current Outpatient Medications  Medication Sig Dispense Refill  . Multiple Vitamin (MULTIVITAMIN) tablet Take 1 tablet by mouth daily.    Marland Kitchen aspirin 81 MG chewable tablet Chew by mouth daily.    . Cholecalciferol 1.25 MG (50000 UT) capsule cholecalciferol (vitamin D3) 1,250 mcg (50,000 unit) capsule  TAKE 1 CAPSULE BY MOUTH ONE TIME PER WEEK    .  levothyroxine (SYNTHROID, LEVOTHROID) 50 MCG tablet Take 50 mcg by mouth daily before breakfast.      No current facility-administered medications for this visit.    Allergies:   Patient has no known allergies.    Social History:  The patient  reports that she has never smoked. She has never used smokeless tobacco. She reports current alcohol use. She reports that she does not use drugs.   Family History:  The patient's family history includes Lung cancer in her mother; Stroke in her paternal grandmother; Valvular heart disease in her father and paternal uncle.    ROS:  Please see the history of  present illness.   Otherwise, review of systems are positive for none.   All other systems are reviewed and negative.    PHYSICAL EXAM: VS:  BP 124/76   Pulse 81   Ht 5\' 6"  (1.676 m)   Wt 234 lb 3.2 oz (106.2 kg)   SpO2 99%   BMI 37.80 kg/m  , BMI Body mass index is 37.8 kg/m. GENERAL:  Well appearing HEENT:  Pupils equal round and reactive, fundi not visualized, oral mucosa unremarkable NECK:  No jugular venous distention, waveform within normal limits, carotid upstroke brisk and symmetric, no bruits, no thyromegaly LYMPHATICS:  No cervical adenopathy LUNGS:  Clear to auscultation bilaterally HEART:  RRR.  PMI not displaced or sustained,S1 and S2 within normal limits, no S3, no S4, no clicks, no rubs, II/VI systolic murmur at the LUSB ABD:  Flat, positive bowel sounds normal in frequency in pitch, no bruits, no rebound, no guarding, no midline pulsatile mass, no hepatomegaly, no splenomegaly EXT:  2 plus pulses throughout, no edema, no cyanosis no clubbing SKIN:  No rashes no nodules NEURO:  Cranial nerves II through XII grossly intact, motor grossly intact throughout PSYCH:  Cognitively intact, oriented to person place and time   EKG:  EKG is ordered today. The ekg ordered today demonstrates sinus rhythm.  Rate 81 bpm.  PACs.     Recent Labs: No results found for requested labs within last 8760 hours.    Lipid Panel No results found for: CHOL, TRIG, HDL, CHOLHDL, VLDL, LDLCALC, LDLDIRECT    Wt Readings from Last 3 Encounters:  11/23/19 234 lb 3.2 oz (106.2 kg)  02/01/19 232 lb (105.2 kg)  06/23/18 219 lb (99.3 kg)      ASSESSMENT AND PLAN:  # Hyperlipidemia:  ASCVD 10 year risk 4.6%.  She did not tolerate rosuvastatin.  Coronary CT-a showed no evidence of any coronary calcifications.  We did discuss the fact that with age her risk will increase and it is very important that we work on diet and exercise.  She would like to see a nutritionist to see how she can improve  her diet and lower lipids this way.  We discussed the Mediterranean diet and she will look into this.  We will repeat her lipids in 6 months.  She understands that if her lipids remain elevated we will likely need to try either another statin or an alternative agent. nutritionist  # s/p AVR:  She underwent aortic valve replacement in 2019 and was doing well.  Interestingly, she had no symptoms despite having critical aortic stenosis.  She is euvolemic.  Recommended that she work on increasing her exercise as above.  Also recommended that she have her children checked for evidence of bicuspid aortic valve.  Apparently both her father and uncle had valvular heart disease.  She needs antibiotic prophylaxis prior  to dental procedures.  Continue aspirin 81 daily.   Current medicines are reviewed at length with the patient today.  The patient does not have concerns regarding medicines.  The following changes have been made:  no change  Labs/ tests ordered today include:   Orders Placed This Encounter  Procedures  . Lipid panel  . Comprehensive metabolic panel  . Ambulatory referral to Nutrition and Diabetic Education  . EKG 12-Lead     Disposition:   FU with Radha Coggins C. Oval Linsey, MD, [facility] in 6 months.     Signed, Tallon Gertz C. Oval Linsey, MD, [facility]  11/23/2019 5:24 PM    [place]

## 2019-11-23 NOTE — Patient Instructions (Addendum)
Medication Instructions:  Your physician recommends that you continue on your current medications as directed. Please refer to the Current Medication list given to you today.  *If you need a refill on your cardiac medications before your next appointment, please call your pharmacy*   Lab Work: FASTING LP/CMET IN 6 MONTHS  If you have labs (blood work) drawn today and your tests are completely normal, you will receive your results only by: Marland Kitchen MyChart Message (if you have MyChart) OR . A paper copy in the mail If you have any lab test that is abnormal or we need to change your treatment, we will call you to review the results.  Testing/Procedures: NONE   Follow-Up: At [facility], you and your health needs are our priority.  As part of our continuing mission to provide you with exceptional heart care, we have created designated Provider Care Teams.  These Care Teams include your primary Cardiologist (physician) and Advanced Practice Providers (APPs -  Physician Assistants and Nurse Practitioners) who all work together to provide you with the care you need, when you need it.  We recommend signing up for the patient portal called "MyChart".  Sign up information is provided on this After Visit Summary.  MyChart is used to connect with patients for Virtual Visits (Telemedicine).  Patients are able to view lab/test results, encounter notes, upcoming appointments, etc.  Non-urgent messages can be sent to your provider as well.   To learn more about what you can do with MyChart, go to [email or web address].    Your next appointment:   6 month(s) FEW DAYS AFTER LABS   The format for your next appointment:   In Person  Provider:   You may see DR [facility]  or one of the following Advanced Practice Providers on your designated Care Team:    Kerin Ransom, PA-C  [place], [place]  Coletta Memos, [place]   You have been referred to NUTRITIONIST   WORK HARDER ON LIMITING YOUR FATTY/FRIED  FOODS, CHEESES, GRAVIES, CREAMY SAUCES

## 2019-12-28 ENCOUNTER — Other Ambulatory Visit: Payer: Self-pay

## 2019-12-28 ENCOUNTER — Encounter: Payer: BC Managed Care – PPO | Attending: Cardiovascular Disease | Admitting: Skilled Nursing Facility1

## 2019-12-28 ENCOUNTER — Encounter: Payer: Self-pay | Admitting: Skilled Nursing Facility1

## 2019-12-28 DIAGNOSIS — E7800 Pure hypercholesterolemia, unspecified: Secondary | ICD-10-CM

## 2019-12-28 NOTE — Progress Notes (Signed)
  Assessment:  Primary concerns today: hypercholesterolemia  Pt states she is struggling to get her cholesterol down and does not want to take medication.  Pt states she has started taking ground flax adding it to her foods. Pt states her sleep is inconsistent worse when she is stressed.  Pt states she likes the impossible products.    MEDICATIONS: see list   DIETARY INTAKE:  Usual eating pattern includes 3 meals and 1-2 snacks per day.  Everyday foods include none stated.  Avoided foods include beef.    24-hr recall:  B ( AM): 2 waffles 1/2 glass milk with butter and cinnomon  Snk ( AM):  L ( PM): salad and yogurt and fruit Snk ( PM): protein bars or chips D ( PM): chicken or pork or salmon or shrimp + peppers, onions, carrots Snk ( PM): nuts Beverages: coffee, water, wine, juice  Usual physical activity: stationary bike 3 nights a week 30 minutes   Estimated energy needs: 1600 calories  Progress Towards Goal(s):  In progress.    Intervention:  Nutrition counseling. Dietitian educated pt on saturated fat and refined sugars.   Goals: Great activity add in your row too for an even better workout; work towards getting out of breath Try yoga or other weight bearing activity 2-3 times a week Lean on plant based proteins Lean on plant based fats  Teaching Method Utilized:  Visual Auditory Hands on  Handouts given during visit include:  Detailed MyPlate  Barriers to learning/adherence to lifestyle change: none stated  Demonstrated degree of understanding via:  Teach Back   Monitoring/Evaluation:  Dietary intake, exercise, and body weight prn.

## 2020-04-19 MED ORDER — AMOXICILLIN 500 MG PO TABS: 2000.0000 mg | ORAL_TABLET | ORAL | 4 refills | Status: DC | PRN

## 2020-04-21 IMAGING — CR DG CHEST 2V
2 series · 2 of 2 positions shown · non-contrast
Comparison: 10/14/2017

CLINICAL DATA: Preop for aortic stenosis

EXAM:
CHEST - 2 VIEW

[w chest pa]
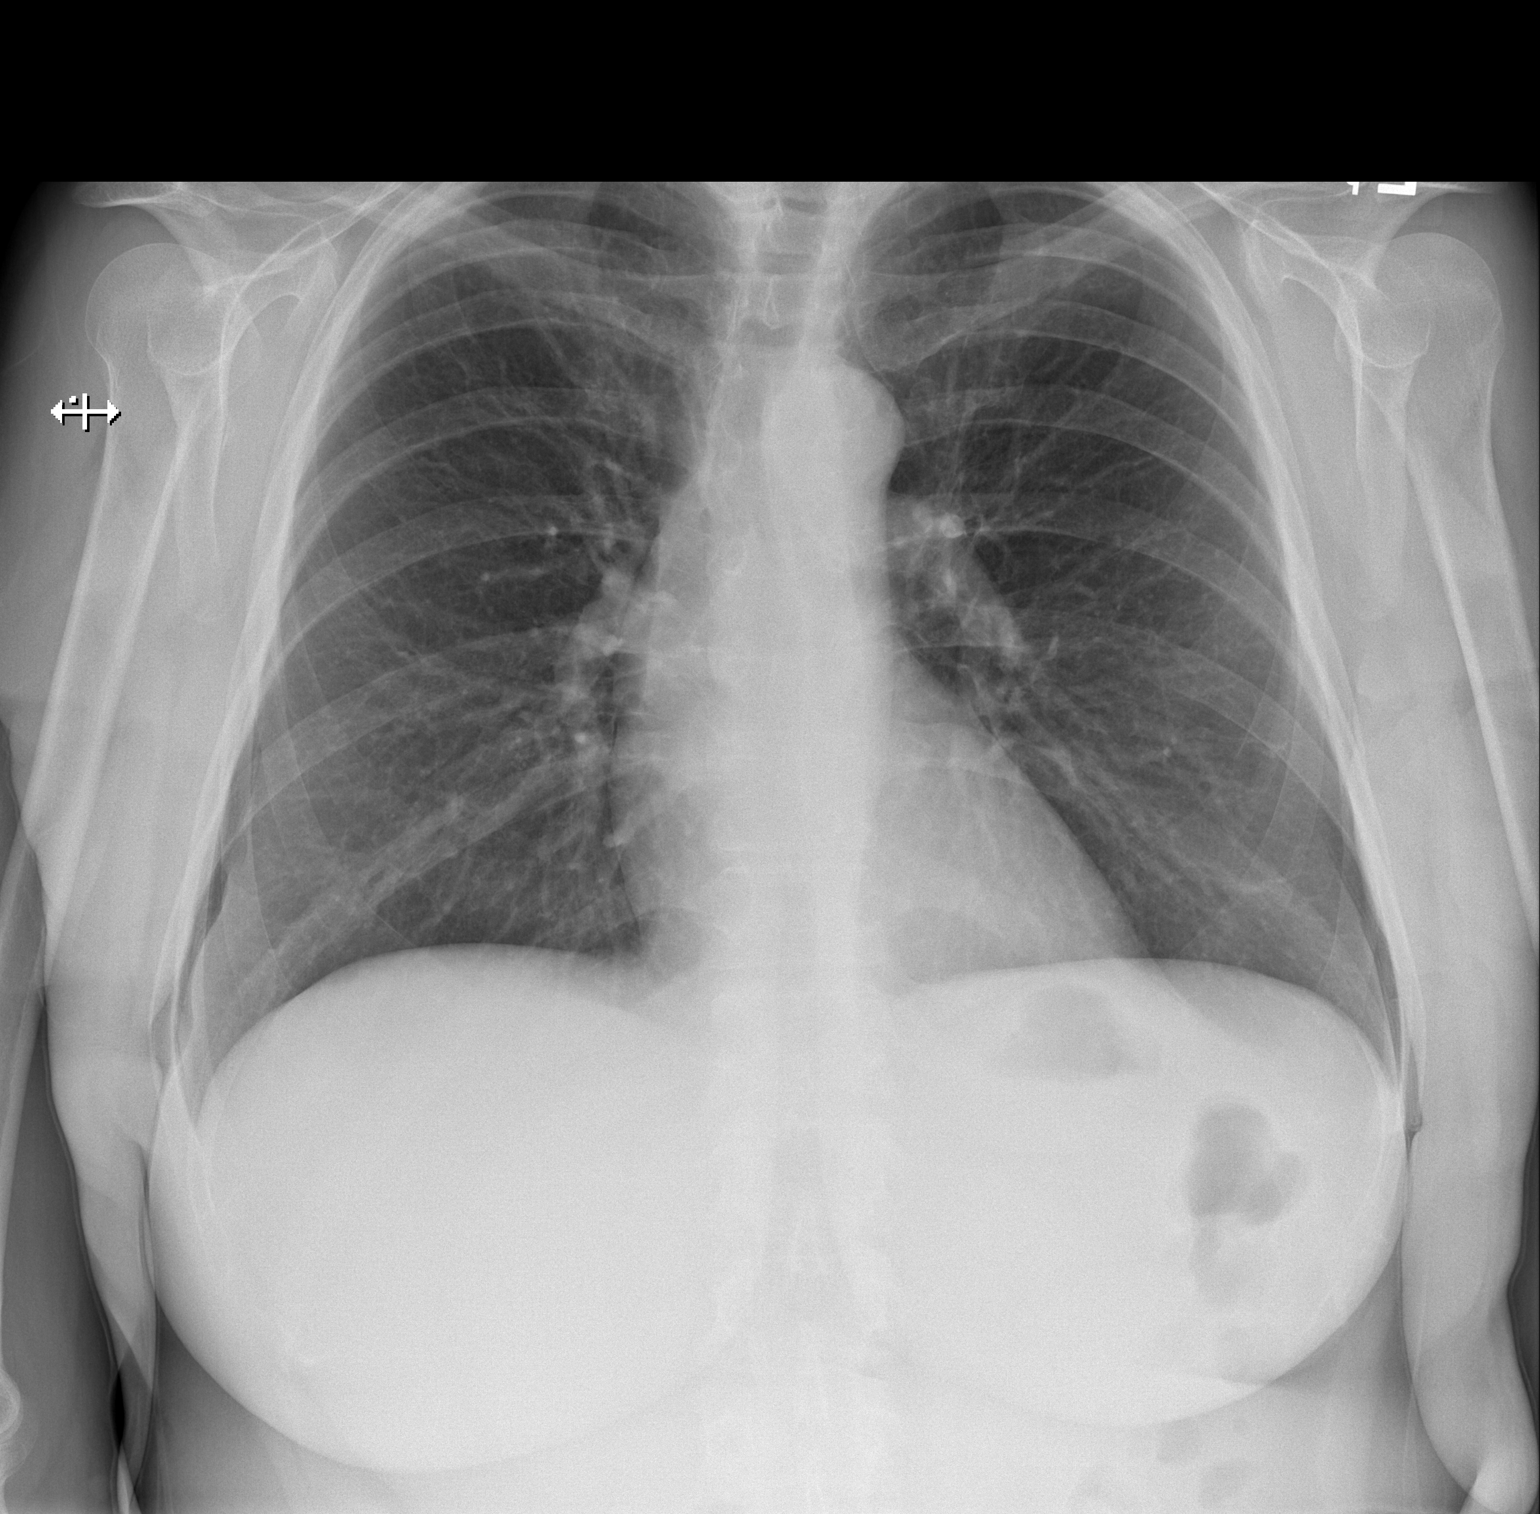

[w chest lat]
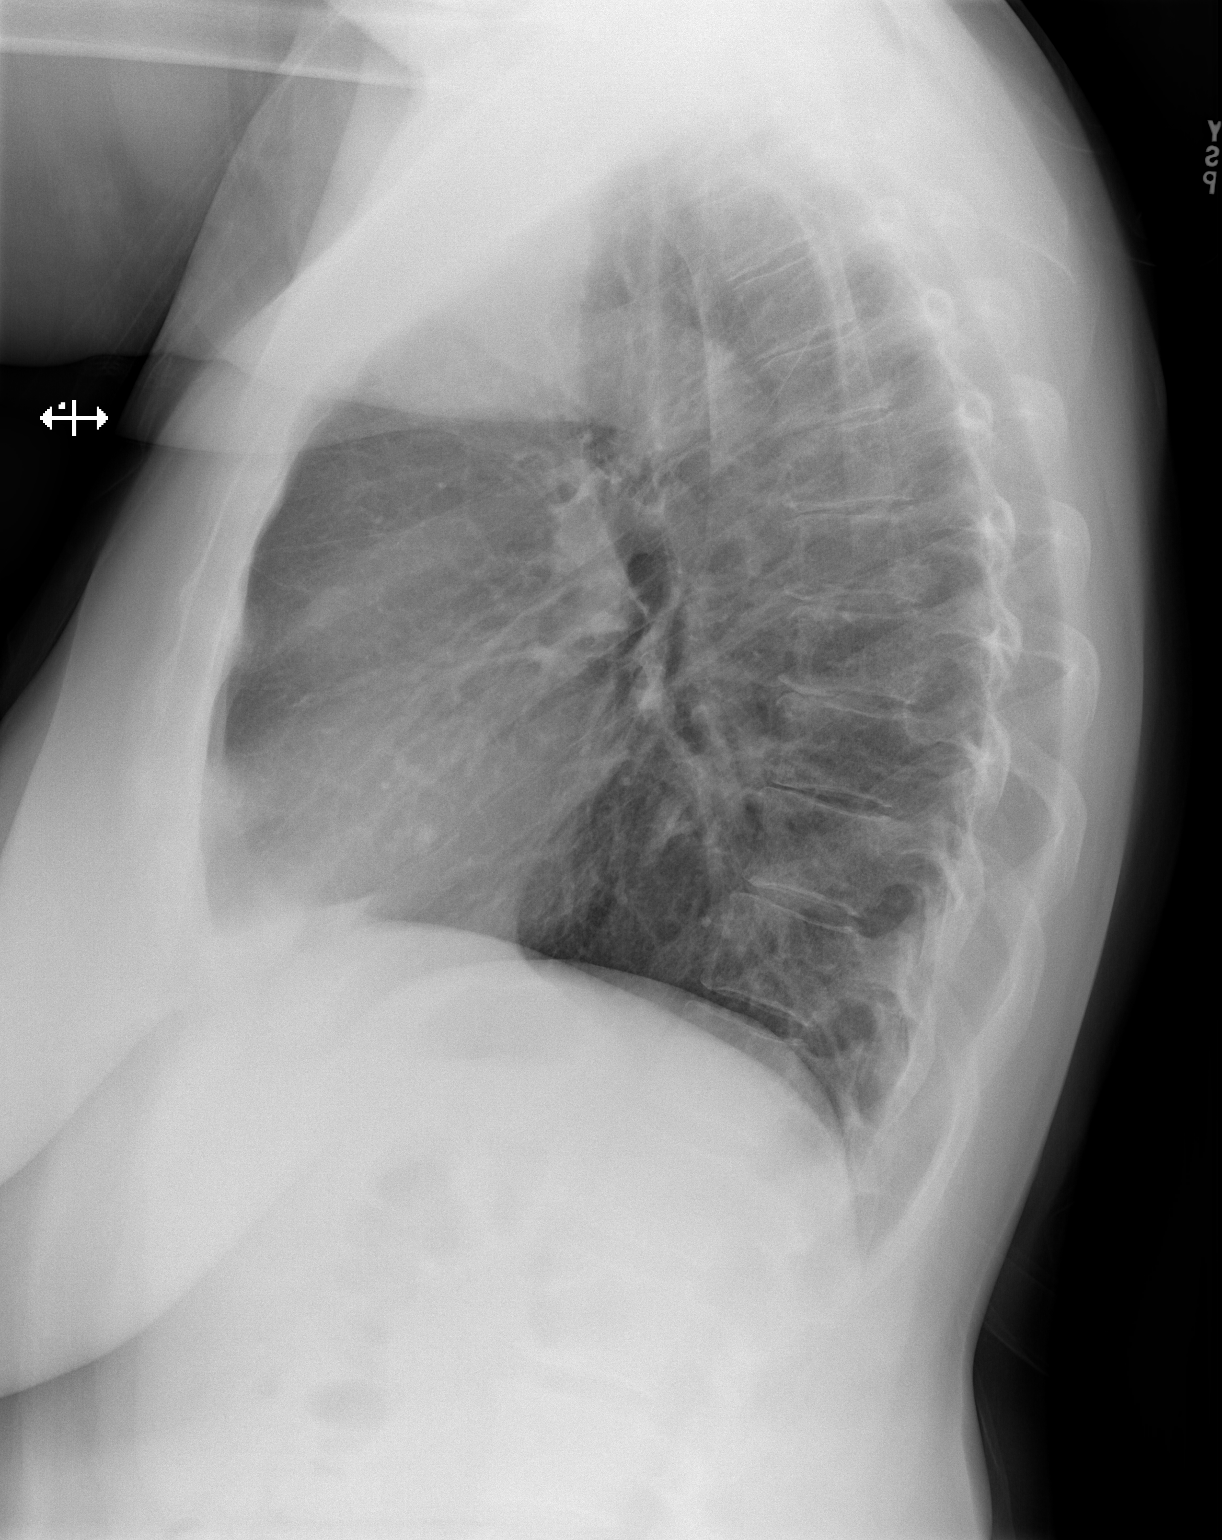

[2 of 2 positions shown; findings below may reference images not displayed]

FINDINGS: Normal heart size and mediastinal contours. There is no edema,
consolidation, effusion, or pneumothorax. No significant osseous
finding.
IMPRESSION: No active cardiopulmonary disease.

## 2020-04-22 IMAGING — CT CT HEART MORP W/ CTA COR W/ SCORE W/ CA W/CM &/OR W/O CM
4 of 7 series · 8 of 20 positions shown, 9 images · IV contrast (APPLIED)
Comparison: None.

CLINICAL DATA: 61-year-old female with severe aortic stenosis being
evaluated for AVR.

EXAM:
Cardiac TAVR CT
TECHNIQUE: The patient was scanned on a Phillips Force scanner. A 120 kV
retrospective scan was triggered in the descending thoracic aorta at
111 HU's. Gantry rotation speed was 250 msecs and collimation was .6
mm. No beta blockade or nitro were given. The 3D data set was
reconstructed in 5% intervals of the R-R cycle. Systolic and
diastolic phases were analyzed on a dedicated work station using
MPR, MIP and VRT modes. The patient received 80 cc of contrast.

[Series 6: best diast 73 % · axial · 0.34mm/px · z∈[+1184,+1247]mm · 2 of 478 slices shown, 3 images]
[im 160/478  vessel]
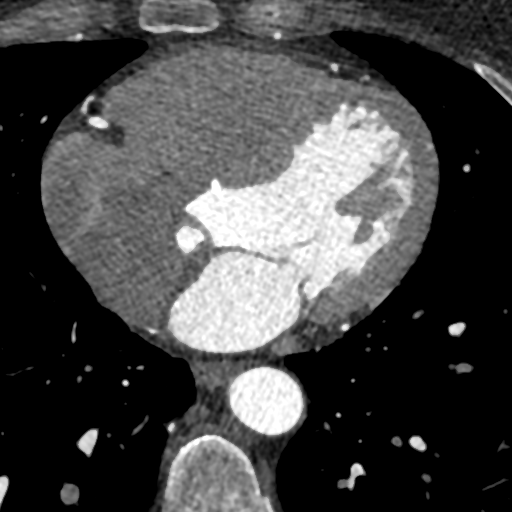
[im 160/478  lung]
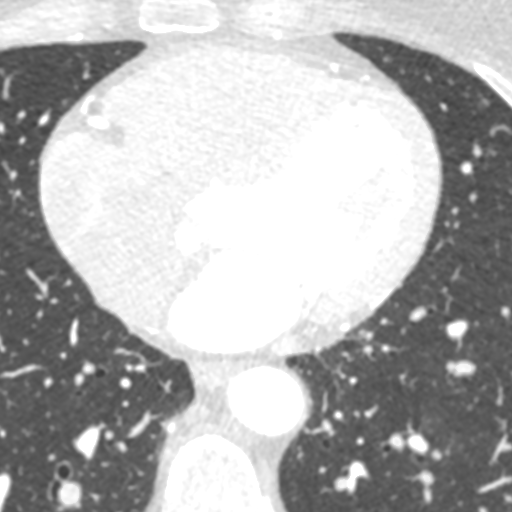
[im 319/478  vessel]
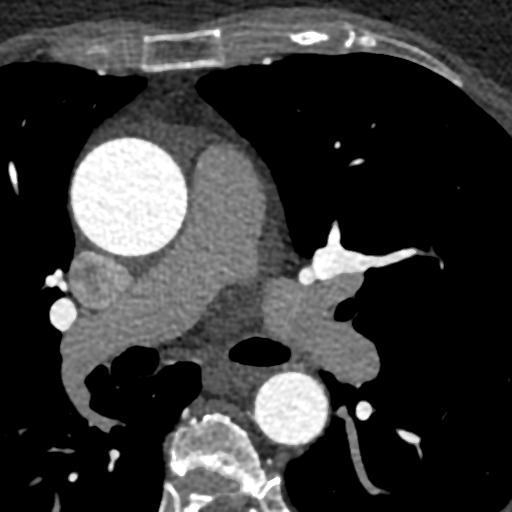

[Series 7: best syst 42 % · axial · 0.34mm/px · z∈[+1184,+1247]mm · 2 of 478 slices shown]
[im 160/478  vessel]
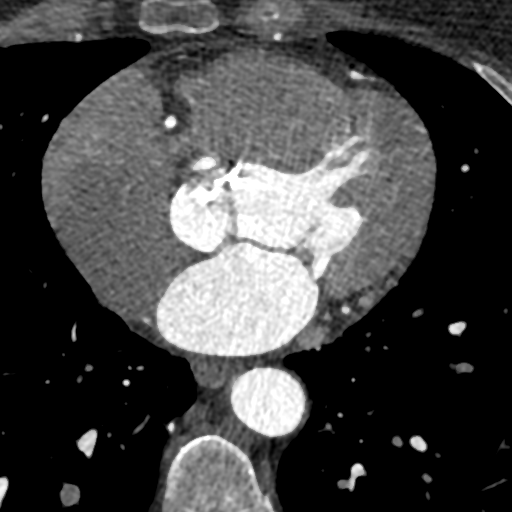
[im 319/478  vessel]
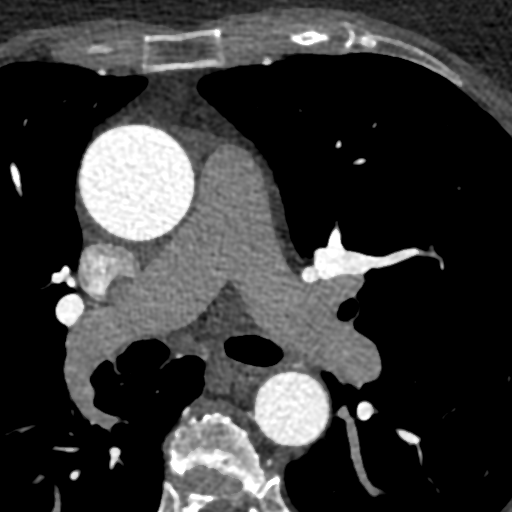

[Series 8: ts diast sharp 73 % · axial · 0.34mm/px · z∈[+1184,+1247]mm · 2 of 478 slices shown]
[im 160/478  lung]
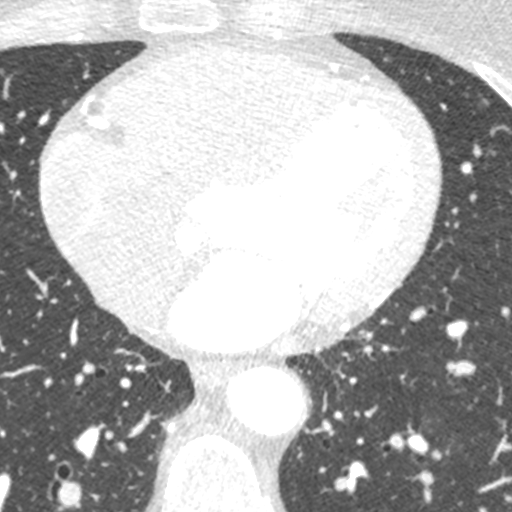
[im 319/478  lung]
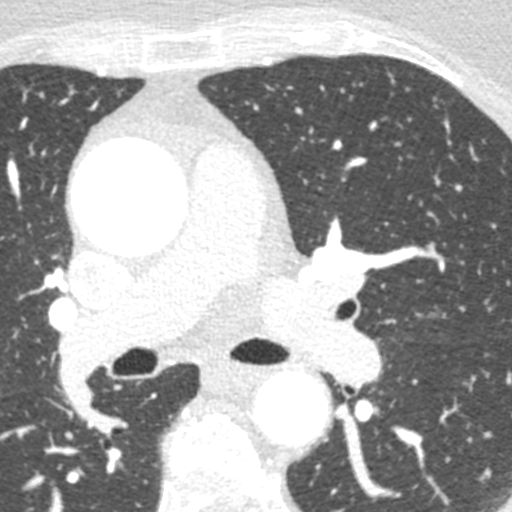

[Series 9: ts syst sharp 42 % · axial · 0.34mm/px · z∈[+1184,+1247]mm · 2 of 478 slices shown]
[im 160/478  lung]
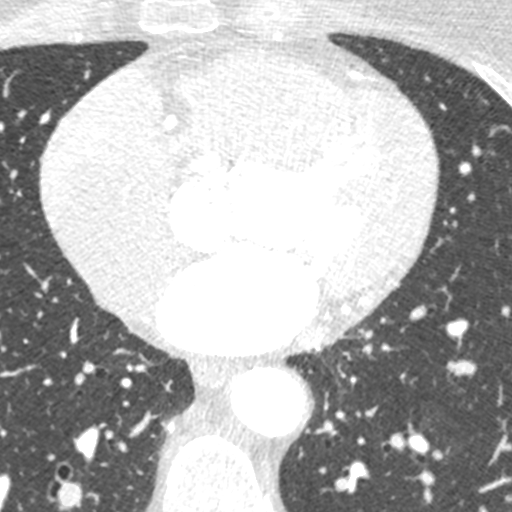
[im 319/478  lung]
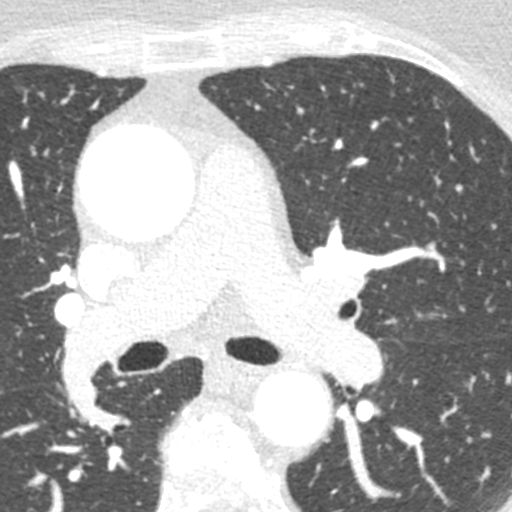

[8 of 20 positions shown; findings below may reference images not displayed]

FINDINGS: Aortic Valve: Functionally bicuspid aortic valve with co-joined left
and right coronary leaflets. Leaflets are severely thickened and
calcified with severely restricted leaflet opening in systole.
Aortic valve is dilated at the sinus level with maximum diameter 42
mm.

Aorta: Dilated aortic root at the sinus level (42 mm), sinotubular
junction (38 mm) and mild ascending aortic aneurysm (41 mm). Normal
size of the aortic arch and descending aorta. Mild diffuse atheroma
and calcifications. No dissection.

Sinotubular Junction: 38 x 36 mm

Ascending Thoracic Aorta: 41 x 41 mm

Aortic Arch: 28 x 25 mm

Descending Thoracic Aorta: 23 x 22 mm

Sinus of Valsalva Measurements:

Non-coronary: 41 mm

Right -coronary: 37 mm

Left -coronary:

Coronary Artery Height above Annulus:

Left Main: 14 mm

Right Coronary: 19 mm

Virtual Basal Annulus Measurements:

Maximum/Minimum Diameter: 33.6 x 27.8 mm

Mean Diameter: 29.8 mm

Perimeter: 98.1 mm

Area: 697 mm2

Coronary Arteries: Normal coronary origin. Right dominance. Calcium
score is 0.

RCA is a large artery that gives rise to PDA and PLA. There is no
plaque.

Left main gives rise to LAD and LCX arteries and has no plaque.

LAD gives rise to one diagonal artery and has minimal
non-obstructive irregularities.

LCX is a non-dominant artery that gives rise to one OM branch and
has no plaque.
IMPRESSION: 1. Functionally bicuspid aortic valve with co-joined left and right
coronary leaflets. Leaflets are severely thickened and calcified
with severely restricted leaflet opening in systole.

2. Dilated aortic root at the sinus level (42 mm), sinotubular
junction (38 mm) and mild ascending aortic aneurysm (41 mm).

3. Normal coronary origin. Right dominance. Calcium score is 0. No
CAD.

4. No thrombus in the left atrial appendage.

EXAM:
OVER-READ INTERPRETATION  CT CHEST

The following report is an over-read performed by radiologist Dr.
Asina Abdii Roho Bada [[email or web address]] on 10/14/2017. This over-read
does not include interpretation of cardiac or coronary anatomy or
pathology. The coronary CTA interpretation by the cardiologist is
attached.
FINDINGS: Vascular: Mild aneurysmal dilatation of the ascending thoracic
aorta, 4 cm maximally. Heart is normal size.

Mediastinum/Nodes: No adenopathy in the mediastinum or hila.

Lungs/Pleura: Visualized lungs clear.  No effusions.

Upper Abdomen: Imaging into the upper abdomen shows no acute
findings.

Musculoskeletal: Chest wall soft tissues are unremarkable. No acute
bony abnormality.
IMPRESSION: Mild ascending thoracic aortic aneurysm, 4 cm. Recommend annual
imaging followup by CTA or MRA. This recommendation follows 3020
ACCF/AHA/AATS/ACR/ASA/SCA/LALO/EBI/LECHU/MANTOSH KUMAR Guidelines for the
Diagnosis and Management of Patients with Thoracic Aortic Disease.
Circulation. 3020; 121: [id number]

## 2020-04-25 IMAGING — DX DG CHEST 1V PORT
1 series · 1 of 1 positions shown · non-contrast
Comparison: 01/07/2018

CLINICAL DATA: Status post aortic valve replacement

EXAM:
PORTABLE CHEST 1 VIEW

[chest]
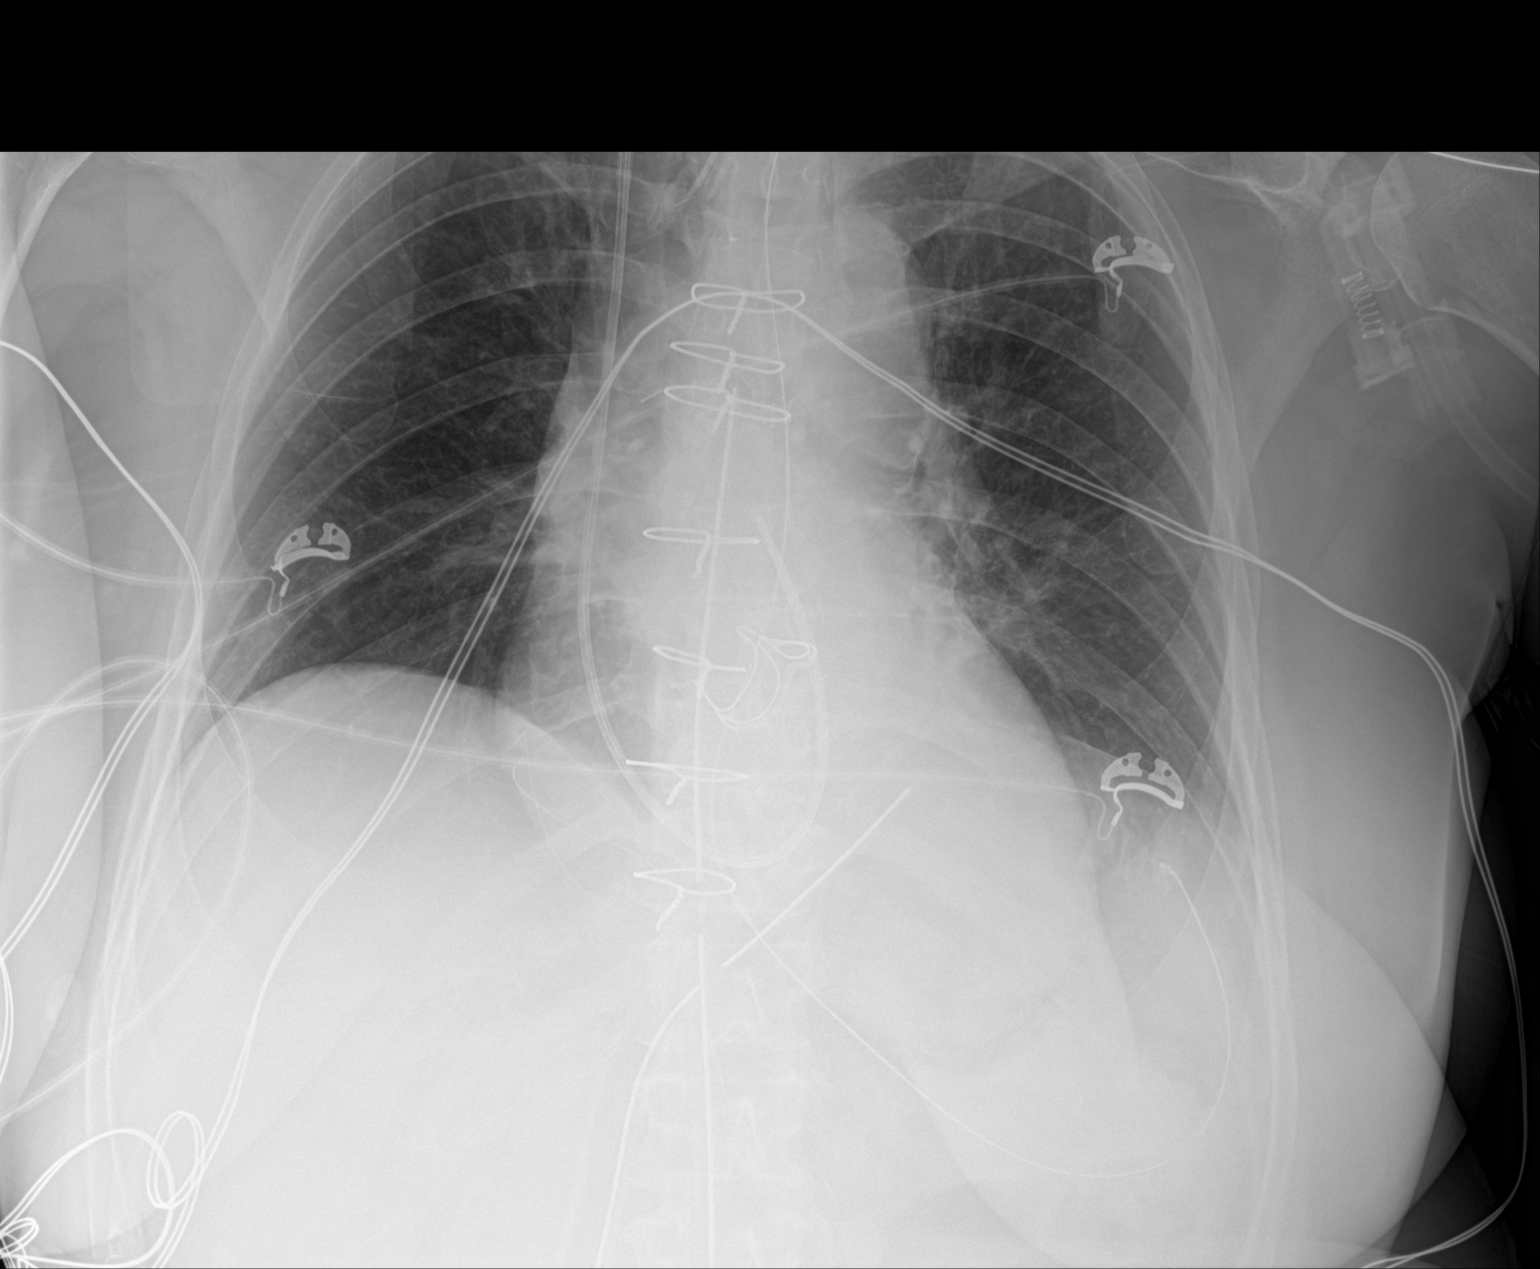

[1 of 1 positions shown; findings below may reference images not displayed]

FINDINGS: Endotracheal tube, nasogastric catheter and Swan-Ganz catheter are
noted in satisfactory position. Mediastinal drain and pericardial
drain are noted in satisfactory position. No pneumothorax is seen.
No focal infiltrate or sizable effusion is noted. Postsurgical
changes are noted.
IMPRESSION: Status post aortic valve repair. Tubes and lines as described. No
acute abnormality noted.

## 2020-05-10 LAB — COMPREHENSIVE METABOLIC PANEL
ALT: 13 IU/L (ref 0–32)
AST: 18 IU/L (ref 0–40)
Albumin/Globulin Ratio: 1.6 (ref 1.2–2.2)
Albumin: 4.3 g/dL (ref 3.8–4.8)
Alkaline Phosphatase: 73 IU/L (ref 44–121)
BUN/Creatinine Ratio: 22 (ref 12–28)
BUN: 21 mg/dL (ref 8–27)
Bilirubin Total: 0.8 mg/dL (ref 0.0–1.2)
CO2: 23 mmol/L (ref 20–29)
Calcium: 9.4 mg/dL (ref 8.7–10.3)
Chloride: 103 mmol/L (ref 96–106)
Creatinine, Ser: 0.94 mg/dL (ref 0.57–1.00)
Globulin, Total: 2.7 g/dL (ref 1.5–4.5)
Glucose: 93 mg/dL (ref 65–99)
Potassium: 5.2 mmol/L (ref 3.5–5.2)
Sodium: 139 mmol/L (ref 134–144)
Total Protein: 7 g/dL (ref 6.0–8.5)
eGFR: 68 mL/min/{1.73_m2} (ref >59)

## 2020-05-10 LAB — LIPID PANEL
Chol/HDL Ratio: 3.9 ratio (ref 0.0–4.4)
Cholesterol, Total: 216 mg/dL — ABNORMAL HIGH (ref 100–199)
HDL: 56 mg/dL (ref >39)
LDL Chol Calc (NIH): 140 mg/dL — ABNORMAL HIGH (ref 0–99)
Triglycerides: 112 mg/dL (ref 0–149)
VLDL Cholesterol Cal: 20 mg/dL (ref 5–40)

## 2020-05-16 ENCOUNTER — Ambulatory Visit (INDEPENDENT_AMBULATORY_CARE_PROVIDER_SITE_OTHER): Payer: BC Managed Care – PPO | Admitting: Cardiovascular Disease

## 2020-05-16 ENCOUNTER — Encounter: Payer: Self-pay | Admitting: Cardiovascular Disease

## 2020-05-16 ENCOUNTER — Other Ambulatory Visit: Payer: Self-pay

## 2020-05-16 VITALS — BP 130/78 | HR 80 | Ht 67.0 in | Wt 232.6 lb

## 2020-05-16 DIAGNOSIS — E669 Obesity, unspecified: Secondary | ICD-10-CM | POA: Diagnosis not present

## 2020-05-16 DIAGNOSIS — E7800 Pure hypercholesterolemia, unspecified: Secondary | ICD-10-CM

## 2020-05-16 DIAGNOSIS — Z952 Presence of prosthetic heart valve: Secondary | ICD-10-CM

## 2020-05-16 NOTE — Patient Instructions (Signed)
Medication Instructions:  Your physician recommends that you continue on your current medications as directed. Please refer to the Current Medication list given to you today.   *If you need a refill on your cardiac medications before your next appointment, please call your pharmacy*  Lab Work: NONE   Testing/Procedures: NONE  Follow-Up: At [facility], you and your health needs are our priority.  As part of our continuing mission to provide you with exceptional heart care, we have created designated Provider Care Teams.  These Care Teams include your primary Cardiologist (physician) and Advanced Practice Providers (APPs -  Physician Assistants and Nurse Practitioners) who all work together to provide you with the care you need, when you need it.  We recommend signing up for the patient portal called "MyChart".  Sign up information is provided on this After Visit Summary.  MyChart is used to connect with patients for Virtual Visits (Telemedicine).  Patients are able to view lab/test results, encounter notes, upcoming appointments, etc.  Non-urgent messages can be sent to your provider as well.   To learn more about what you can do with MyChart, go to [email or web address].    Your next appointment:   12 month(s)  The format for your next appointment:   In Person  Provider:   You may see DR [facility] or one of the following Advanced Practice Providers on your designated Care Team:    Sande Rives, PA-C  Coletta Memos, FNP

## 2020-05-16 NOTE — Progress Notes (Signed)
Cardiology Office Note  Date:  05/16/2020   Megan Duke, Megan Duke Aug 17, 1955, MRN [id number]  PCP:  Harlan Stains, MD  Cardiologist:   Skeet Latch, MD   No chief complaint on file.   History of Present Illness: Megan Duke is a 65 y.o. female with bicuspid aortic valve status post aortic valve replacement, hyperlipidemia, morbid obesity, and hypothyroidism here to establish care.  She was noted to have a systolic murmur in 6010.  Echo at that time revealed LVEF 55 to 60% with grade 1 diastolic dysfunction and severe aortic stenosis.  She also had a mild ascending aortic aneurysm.  Mean gradient at that time was 40 mmHg.  She was asymptomatic.  She had a coronary CT-A that revealed no coronary artery disease and her ascending aortic aneurysm was 4.2 cm.  Repeat echo 12/2017 showed an increase in her mean aortic valve gradient from 40 to 67 mmHg.  She was ultimately referred for aortic valve replacement at [facility] with Dr. Cyndia Bent.  She had a 27 mm Edwards Inspiris Resilia pericardial aortic valve placed 01/2018.  Postoperative course was complicated by complete heart block and temporary pacemaker but no permanent device was needed.  Repeat echo 01/2019 revealed LVEF 55 to 60% with a mean gradient of 14 mmHg.  Megan Duke tried rosuvastatin but developed mental fogginess.  She started taking flax seed and attributes this to her improvement in her lipids.  She bikes 30 minutes three times per week.  She feels pretty good with exercise. She has no exertional chest pain or shortness of breath.  She denies LE edema, orthopnea or PND.  She mostly cooks at home and tries to watch her diet.  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  Her son is getting married in [place] this fall.    Past Medical History:  Diagnosis Date  . Adenomatous polyp   . Anemia   . Arthritis    fingers  . Dyslipidemia   . Hypothyroidism   . Multinodular goiter   . Non morbid obesity due to excess  calories   . Nontoxic goiter   . Obesity (BMI 30-39.9) 11/23/2019  . Pneumonia   . Pure hypercholesterolemia 11/23/2019  . Severe aortic stenosis   . Varicose veins of both lower extremities     Past Surgical History:  Procedure Laterality Date  . AORTIC VALVE REPLACEMENT N/A 01/11/2018   Procedure: AORTIC VALVE REPLACEMENT (AVR);  Surgeon: Gaye Pollack, MD;  Location: [place];  Service: Open Heart Surgery;  Laterality: N/A;  . COLONOSCOPY    . TEE WITHOUT CARDIOVERSION N/A 01/11/2018   Procedure: TRANSESOPHAGEAL ECHOCARDIOGRAM (TEE);  Surgeon: Gaye Pollack, MD;  Location: [place];  Service: Open Heart Surgery;  Laterality: N/A;  . WISDOM TOOTH EXTRACTION       Current Outpatient Medications  Medication Sig Dispense Refill  . amoxicillin (AMOXIL) 500 MG tablet Take 4 tablets (2,000 mg total) by mouth as needed. 1 hour prior to dental procedure 4 tablet 4  . aspirin 81 MG chewable tablet Chew by mouth daily.    . Cholecalciferol 1.25 MG (50000 UT) capsule cholecalciferol (vitamin D3) 1,250 mcg (50,000 unit) capsule  TAKE 1 CAPSULE BY MOUTH ONE TIME PER WEEK    . levothyroxine (SYNTHROID, LEVOTHROID) 50 MCG tablet Take 50 mcg by mouth daily before breakfast.     . Multiple Vitamin (MULTIVITAMIN) tablet Take 1 tablet by mouth daily.     No current facility-administered  medications for this visit.    Allergies:   Duke has no known allergies.    Social History:  Megan Duke  reports that she has never smoked. She has never used smokeless tobacco. She reports current alcohol use. She reports that she does not use drugs.   Family History:  Megan Duke's family history includes Lung cancer in her mother; Stroke in her paternal grandmother; Valvular heart disease in her father and paternal uncle.    ROS:  Please see Megan history of present illness.   Otherwise, review of systems are positive for none.   All other systems are reviewed and negative.    PHYSICAL EXAM: VS:  BP 130/78    Pulse 80   Ht 5\' 7"  (1.702 m)   Wt 232 lb 9.6 oz (105.5 kg)   SpO2 96%   BMI 36.43 kg/m  , BMI Body mass index is 36.43 kg/m. GENERAL:  Well appearing HEENT:  Pupils equal round and reactive, fundi not visualized, oral mucosa unremarkable NECK:  No jugular venous distention, waveform within normal limits, carotid upstroke brisk and symmetric, no bruits, no thyromegaly LYMPHATICS:  No cervical adenopathy LUNGS:  Clear to auscultation bilaterally HEART:  RRR.  PMI not displaced or sustained,S1 and S2 within normal limits, no S3, no S4, no clicks, no rubs, II/VI systolic murmur at Megan LUSB ABD:  Flat, positive bowel sounds normal in frequency in pitch, no bruits, no rebound, no guarding, no midline pulsatile mass, no hepatomegaly, no splenomegaly EXT:  2 plus pulses throughout, no edema, no cyanosis no clubbing SKIN:  No rashes no nodules NEURO:  Cranial nerves II through XII grossly intact, motor grossly intact throughout PSYCH:  Cognitively intact, oriented to person place and time   EKG:  EKG is not ordered today. Megan ekg ordered 11/23/19 demonstrates sinus rhythm.  Rate 81 bpm.  PACs.     Recent Labs: 05/10/2020: ALT 13; BUN 21; Creatinine, Ser 0.94; Potassium 5.2; Sodium 139    Lipid Panel    Component Value Date/Time   CHOL 216 (H) 05/10/2020 0818   TRIG 112 05/10/2020 0818   HDL 56 05/10/2020 0818   CHOLHDL 3.9 05/10/2020 0818   LDLCALC 140 (H) 05/10/2020 0818      Wt Readings from Last 3 Encounters:  05/16/20 232 lb 9.6 oz (105.5 kg)  12/28/19 232 lb 8 oz (105.5 kg)  11/23/19 234 lb 3.2 oz (106.2 kg)      ASSESSMENT AND PLAN:  # Hyperlipidemia:  ASCVD 10 year risk 4.6%.  She did not tolerate rosuvastatin.  Coronary CT-A showed no evidence of any coronary calcifications. She wants to continue working on diet and exercise.  # s/p AVR:  She underwent aortic valve replacement in 2019 and was doing well.  Valve stable 01/2019.   Interestingly, she had no symptoms  despite having critical aortic stenosis.  She is euvolemic.  Repeat echo 01/2022.  Continue antibiotic prophylaxis.   Current medicines are reviewed at length with Megan Duke today.  Megan Duke does not have concerns regarding medicines.  Megan following changes have been made:  no change  Labs/ tests ordered today include:   No orders of Megan defined types were placed in this encounter.    Disposition:   FU with Miliano Cotten C. Oval Linsey, MD, [facility] in 1 year.    Signed, Doninique Lwin C. Oval Linsey, MD, [facility]  05/16/2020 4:38 PM    [place]

## 2021-04-26 ENCOUNTER — Other Ambulatory Visit: Payer: Self-pay | Admitting: Cardiovascular Disease

## 2021-08-01 ENCOUNTER — Ambulatory Visit (HOSPITAL_BASED_OUTPATIENT_CLINIC_OR_DEPARTMENT_OTHER): Payer: BC Managed Care – PPO | Admitting: Cardiovascular Disease

## 2021-10-08 ENCOUNTER — Encounter (HOSPITAL_BASED_OUTPATIENT_CLINIC_OR_DEPARTMENT_OTHER): Payer: Self-pay | Admitting: Cardiovascular Disease

## 2021-10-08 ENCOUNTER — Ambulatory Visit (HOSPITAL_BASED_OUTPATIENT_CLINIC_OR_DEPARTMENT_OTHER): Payer: BC Managed Care – PPO | Admitting: Cardiovascular Disease

## 2021-10-08 DIAGNOSIS — Z952 Presence of prosthetic heart valve: Secondary | ICD-10-CM

## 2021-10-08 DIAGNOSIS — E7800 Pure hypercholesterolemia, unspecified: Secondary | ICD-10-CM | POA: Diagnosis not present

## 2021-10-08 NOTE — Assessment & Plan Note (Signed)
She notes that lipids were improving when recently checked with her PCP.  She continues to be physically active and is building a room addition at her home.  She had no CAD on her calcium score in 2019.  We will repeat the study next year.  Keep working on diet and exercise for now.

## 2021-10-08 NOTE — Progress Notes (Signed)
Cardiology Office Note  Date:  10/08/2021   ID:  Armentha, Branagan 12/30/1955, MRN [id number]  PCP:  Harlan Stains, MD  Cardiologist:   Skeet Latch, MD   No chief complaint on file.   History of Present Illness: Megan Duke is a 66 y.o. female with bicuspid aortic valve status post aortic valve replacement, hyperlipidemia, morbid obesity, and hypothyroidism here for follow up. She was initially seen on 11/23/2019 to establish care. She was noted to have a systolic murmur in 8563.  Echo at that time revealed LVEF 55 to 60% with grade 1 diastolic dysfunction and severe aortic stenosis.  She also had a mild ascending aortic aneurysm.  Mean gradient at that time was 40 mmHg.  She was asymptomatic.  She had a coronary CT-A that revealed no coronary artery disease and her ascending aortic aneurysm was 4.2 cm.  Repeat echo 12/2017 showed an increase in her mean aortic valve gradient from 40 to 67 mmHg.  She was ultimately referred for aortic valve replacement at [facility] with Dr. Cyndia Bent.  She had a 27 mm Edwards Inspiris Resilia pericardial aortic valve placed 01/2018.  Postoperative course was complicated by complete heart block and temporary pacemaker but no permanent device was needed.  Repeat echo 01/2019 revealed LVEF 55 to 60% with a mean gradient of 14 mmHg.  Ms. Bartolini tried rosuvastatin but developed mental fogginess.  She started taking flax seed and attributes this to her improvement in her lipids.  Today, she seems to be doing well. She has been retired for over a year but has been keeping busy building a room extension in her home, while doing the physical labor herself. Lately, she states that she is mostly limited by heat exhaustion, which is typical for her. She endorses headaches that she believes are due to dehydration. In the morning, she will sometimes experience headaches, and her husband has not noticed any difficulty or cessation in her breathing while sleeping. She  reports that her ankle and finger swelling "comes and goes."  Her swelling is mostly noticeable in her fingers; she will be sure to drink more water when this is noticed. She denies any palpitations or chest pain. No lightheadedness, syncope, orthopnea, or PND.   Past Medical History:  Diagnosis Date   Adenomatous polyp    Anemia    Arthritis    fingers   Dyslipidemia    Hypothyroidism    Multinodular goiter    Non morbid obesity due to excess calories    Nontoxic goiter    Obesity (BMI 30-39.9) 11/23/2019   Pneumonia    Pure hypercholesterolemia 11/23/2019   Severe aortic stenosis    Varicose veins of both lower extremities     Past Surgical History:  Procedure Laterality Date   AORTIC VALVE REPLACEMENT N/A 01/11/2018   Procedure: AORTIC VALVE REPLACEMENT (AVR);  Surgeon: Gaye Pollack, MD;  Location: [place];  Service: Open Heart Surgery;  Laterality: N/A;   COLONOSCOPY     TEE WITHOUT CARDIOVERSION N/A 01/11/2018   Procedure: TRANSESOPHAGEAL ECHOCARDIOGRAM (TEE);  Surgeon: Gaye Pollack, MD;  Location: [place];  Service: Open Heart Surgery;  Laterality: N/A;   WISDOM TOOTH EXTRACTION       Current Outpatient Medications  Medication Sig Dispense Refill   amoxicillin (AMOXIL) 500 MG tablet TAKE 4 TABLET BY MOUTH 1 HOUR PRIOR TO DENTAL PROCEDURE 4 tablet 4   aspirin 81 MG chewable tablet Chew by mouth daily.  cholecalciferol (VITAMIN D3) 25 MCG (1000 UNIT) tablet Take 1,000 Units by mouth daily.     levothyroxine (SYNTHROID, LEVOTHROID) 50 MCG tablet Take 50 mcg by mouth daily before breakfast.      Multiple Vitamin (MULTIVITAMIN) tablet Take 1 tablet by mouth daily.     No current facility-administered medications for this visit.    Allergies:   Patient has no known allergies.    Social History:  The patient  reports that she has never smoked. She has never used smokeless tobacco. She reports current alcohol use. She reports that she does not use drugs.   Family  History:  The patient's family history includes Lung cancer in her mother; Stroke in her paternal grandmother; Valvular heart disease in her father and paternal uncle.    ROS:   Please see the history of present illness.    (+) Bilateral ankle and finger edema (+) Headaches  All other systems are reviewed and negative.    PHYSICAL EXAM: VS:  BP 120/84 (BP Location: Left Arm, Patient Position: Sitting, Cuff Size: Large)   Pulse 75   Ht '5\' 7"'$  (1.702 m)   Wt 241 lb 3.2 oz (109.4 kg)   BMI 37.78 kg/m  , BMI Body mass index is 37.78 kg/m. GENERAL:  Well appearing HEENT:  Pupils equal round and reactive, fundi not visualized, oral mucosa unremarkable NECK:  No jugular venous distention, waveform within normal limits, carotid upstroke brisk and symmetric, no bruits, no thyromegaly LUNGS:  Clear to auscultation bilaterally HEART:  RRR.  PMI not displaced or sustained,S1 and S2 within normal limits, no S3, no S4, no clicks, no rubs, III/VI systolic murmur at the LUSB ABD:  Flat, positive bowel sounds normal in frequency in pitch, no bruits, no rebound, no guarding, no midline pulsatile mass, no hepatomegaly, no splenomegaly EXT:  2 plus pulses throughout, no edema, no cyanosis no clubbing SKIN:  No rashes no nodules NEURO:  Cranial nerves II through XII grossly intact, motor grossly intact throughout PSYCH:  Cognitively intact, oriented to person place and time   EKG:  EKG is personally reviewed.  10/08/21: Sinus rhythm. Rate 74 bpm.   11/23/19: sinus rhythm.  Rate 81 bpm.  PACs.     Recent Labs: No results found for requested labs within last 365 days.    Lipid Panel    Component Value Date/Time   CHOL 216 (H) 05/10/2020 0818   TRIG 112 05/10/2020 0818   HDL 56 05/10/2020 0818   CHOLHDL 3.9 05/10/2020 0818   LDLCALC 140 (H) 05/10/2020 0818      Wt Readings from Last 3 Encounters:  10/08/21 241 lb 3.2 oz (109.4 kg)  05/16/20 232 lb 9.6 oz (105.5 kg)  12/28/19 232 lb 8 oz  (105.5 kg)      ASSESSMENT AND PLAN:  S/P AVR She had a bicuspid valve and bioprosthetic AVR.  She is doing very well and has no heart failure symptoms.  She still has a very mild murmur on exam.  No volume overload.  Plan to repeat her echo in a year.  Continue aspirin.  Continue antibiotic prophylaxis.  Pure hypercholesterolemia She notes that lipids were improving when recently checked with her PCP.  She continues to be physically active and is building a room addition at her home.  She had no CAD on her calcium score in 2019.  We will repeat the study next year.  Keep working on diet and exercise for now.    Current medicines  are reviewed at length with the patient today.  The patient does not have concerns regarding medicines.  The following changes have been made:  no change  Labs/ tests ordered today include:   Orders Placed This Encounter  Procedures   CT CARDIAC SCORING (SELF PAY ONLY)   EKG 12-Lead   ECHOCARDIOGRAM COMPLETE     Disposition:   FU with Darel Ricketts C. Oval Linsey, MD, [facility] in 1 year.   I,Breanna Adamick,acting as a scribe for Skeet Latch, MD.,have documented all relevant documentation on the behalf of Skeet Latch, MD,as directed by  Skeet Latch, MD while in the presence of Skeet Latch, MD.   I, [place] Oval Linsey, MD have reviewed all documentation for this visit.  The documentation of the exam, diagnosis, procedures, and orders on 10/08/2021 are all accurate and complete.   Signed, Kveon Casanas C. Oval Linsey, MD, [facility]  10/08/2021 10:58 AM     [facility]

## 2021-10-08 NOTE — Assessment & Plan Note (Addendum)
She had a bicuspid valve and bioprosthetic AVR.  She is doing very well and has no heart failure symptoms.  She still has a very mild murmur on exam.  No volume overload.  Plan to repeat her echo in a year.  Continue aspirin.  Continue antibiotic prophylaxis.

## 2021-10-08 NOTE — Patient Instructions (Signed)
Medication Instructions:  Your physician recommends that you continue on your current medications as directed. Please refer to the Current Medication list given to you today.   *If you need a refill on your cardiac medications before your next appointment, please call your pharmacy*  Lab Work: NONE  Testing/Procedures: Your physician has requested that you have an echocardiogram. Echocardiography is a painless test that uses sound waves to create images of your heart. It provides your doctor with information about the size and shape of your heart and how well your heart's chambers and valves are working. This procedure takes approximately one hour. There are no restrictions for this procedure.  CALCIUM SCORE - THIS WILL COST YOU $99 OUT OF POCKET  BOTH OF ABOVE TO BE DONE IN 12 MONTHS ABOUT A WEEK PRIOR TO FOLLOW UP   Follow-Up: At [facility], you and your health needs are our priority.  As part of our continuing mission to provide you with exceptional heart care, we have created designated Provider Care Teams.  These Care Teams include your primary Cardiologist (physician) and Advanced Practice Providers (APPs -  Physician Assistants and Nurse Practitioners) who all work together to provide you with the care you need, when you need it.  We recommend signing up for the patient portal called "MyChart".  Sign up information is provided on this After Visit Summary.  MyChart is used to connect with patients for Virtual Visits (Telemedicine).  Patients are able to view lab/test results, encounter notes, upcoming appointments, etc.  Non-urgent messages can be sent to your provider as well.   To learn more about what you can do with MyChart, go to [email or web address].    Your next appointment:   AFTER YOU HAVE YOUR ECHO AND CALCIUM SCORE IN 12 month(s)  The format for your next appointment:   In Person  Provider:   Skeet Latch, MD{

## 2022-03-18 ENCOUNTER — Other Ambulatory Visit: Payer: Self-pay | Admitting: Obstetrics and Gynecology

## 2022-03-18 DIAGNOSIS — Z13820 Encounter for screening for osteoporosis: Secondary | ICD-10-CM

## 2022-05-13 DIAGNOSIS — H179 Unspecified corneal scar and opacity: Secondary | ICD-10-CM | POA: Diagnosis not present

## 2022-05-13 DIAGNOSIS — H2513 Age-related nuclear cataract, bilateral: Secondary | ICD-10-CM | POA: Diagnosis not present

## 2022-05-13 DIAGNOSIS — H31013 Macula scars of posterior pole (postinflammatory) (post-traumatic), bilateral: Secondary | ICD-10-CM | POA: Diagnosis not present

## 2022-05-23 DIAGNOSIS — H6691 Otitis media, unspecified, right ear: Secondary | ICD-10-CM | POA: Diagnosis not present

## 2022-05-23 DIAGNOSIS — H6692 Otitis media, unspecified, left ear: Secondary | ICD-10-CM | POA: Diagnosis not present

## 2022-05-23 DIAGNOSIS — J0100 Acute maxillary sinusitis, unspecified: Secondary | ICD-10-CM | POA: Diagnosis not present

## 2022-05-23 DIAGNOSIS — H6121 Impacted cerumen, right ear: Secondary | ICD-10-CM | POA: Diagnosis not present

## 2022-05-30 ENCOUNTER — Other Ambulatory Visit: Payer: BC Managed Care – PPO

## 2022-06-02 DIAGNOSIS — J0191 Acute recurrent sinusitis, unspecified: Secondary | ICD-10-CM | POA: Diagnosis not present

## 2022-06-02 DIAGNOSIS — R052 Subacute cough: Secondary | ICD-10-CM | POA: Diagnosis not present

## 2022-06-02 DIAGNOSIS — J3489 Other specified disorders of nose and nasal sinuses: Secondary | ICD-10-CM | POA: Diagnosis not present

## 2022-06-02 DIAGNOSIS — H6993 Unspecified Eustachian tube disorder, bilateral: Secondary | ICD-10-CM | POA: Diagnosis not present

## 2022-06-03 DIAGNOSIS — Z1231 Encounter for screening mammogram for malignant neoplasm of breast: Secondary | ICD-10-CM | POA: Diagnosis not present

## 2022-06-03 DIAGNOSIS — Z6839 Body mass index (BMI) 39.0-39.9, adult: Secondary | ICD-10-CM | POA: Diagnosis not present

## 2022-06-03 DIAGNOSIS — Z01419 Encounter for gynecological examination (general) (routine) without abnormal findings: Secondary | ICD-10-CM | POA: Diagnosis not present

## 2022-06-03 DIAGNOSIS — M8580 Other specified disorders of bone density and structure, unspecified site: Secondary | ICD-10-CM | POA: Diagnosis not present

## 2022-06-03 DIAGNOSIS — E559 Vitamin D deficiency, unspecified: Secondary | ICD-10-CM | POA: Diagnosis not present

## 2022-06-03 DIAGNOSIS — Z1211 Encounter for screening for malignant neoplasm of colon: Secondary | ICD-10-CM | POA: Diagnosis not present

## 2022-06-05 ENCOUNTER — Telehealth: Payer: Self-pay | Admitting: Cardiovascular Disease

## 2022-06-05 MED ORDER — AMOXICILLIN 500 MG PO TABS: ORAL_TABLET | ORAL | 4 refills | Status: DC

## 2022-06-05 NOTE — Telephone Encounter (Signed)
Rx request sent to pharmacy.  Appt scheduled with Laurann Montana, NP 10/2022.

## 2022-06-05 NOTE — Telephone Encounter (Signed)
*  STAT* If patient is at the pharmacy, call can be transferred to refill team.   1. Which medications need to be refilled? (please list name of each medication and dose if known)    amoxicillin (AMOXIL) 500 MG tablet    2. Which pharmacy/location (including street and city if local pharmacy) is medication to be sent to? [facility] [place], [place]   3. Do they need a 30 day or 90 day supply? Just needs 4 tablets prior to dental appt

## 2022-06-23 DIAGNOSIS — H6993 Unspecified Eustachian tube disorder, bilateral: Secondary | ICD-10-CM | POA: Diagnosis not present

## 2022-08-21 DIAGNOSIS — E559 Vitamin D deficiency, unspecified: Secondary | ICD-10-CM | POA: Diagnosis not present

## 2022-10-02 ENCOUNTER — Ambulatory Visit (INDEPENDENT_AMBULATORY_CARE_PROVIDER_SITE_OTHER): Payer: Medicare PPO

## 2022-10-02 DIAGNOSIS — Z952 Presence of prosthetic heart valve: Secondary | ICD-10-CM | POA: Diagnosis not present

## 2022-10-02 LAB — ECHOCARDIOGRAM COMPLETE
AR max vel: 1.16 cm2
AV Area VTI: 1.14 cm2
AV Area mean vel: 1.09 cm2
AV Mean grad: 10 mmHg
AV Peak grad: 17.6 mmHg
Ao pk vel: 2.1 m/s
Area-P 1/2: 3.91 cm2
S' Lateral: 2.75 cm

## 2022-10-03 ENCOUNTER — Ambulatory Visit (HOSPITAL_BASED_OUTPATIENT_CLINIC_OR_DEPARTMENT_OTHER)
Admission: RE | Admit: 2022-10-03 | Discharge: 2022-10-03 | Disposition: A | Discharge status: Home / Self Care | Payer: Medicare PPO | Source: Ambulatory Visit | Attending: Cardiovascular Disease | Admitting: Cardiovascular Disease

## 2022-10-03 DIAGNOSIS — E7800 Pure hypercholesterolemia, unspecified: Secondary | ICD-10-CM | POA: Insufficient documentation

## 2022-10-03 DIAGNOSIS — Z952 Presence of prosthetic heart valve: Secondary | ICD-10-CM | POA: Insufficient documentation

## 2022-10-10 ENCOUNTER — Ambulatory Visit (HOSPITAL_BASED_OUTPATIENT_CLINIC_OR_DEPARTMENT_OTHER): Payer: Medicare PPO | Admitting: Family

## 2022-10-10 ENCOUNTER — Encounter (HOSPITAL_BASED_OUTPATIENT_CLINIC_OR_DEPARTMENT_OTHER): Payer: Self-pay | Admitting: Family

## 2022-10-10 VITALS — BP 128/78 | HR 85 | Ht 67.0 in | Wt 245.0 lb

## 2022-10-10 DIAGNOSIS — I7121 Aneurysm of the ascending aorta, without rupture: Secondary | ICD-10-CM

## 2022-10-10 DIAGNOSIS — Z952 Presence of prosthetic heart valve: Secondary | ICD-10-CM | POA: Diagnosis not present

## 2022-10-10 DIAGNOSIS — E782 Mixed hyperlipidemia: Secondary | ICD-10-CM | POA: Diagnosis not present

## 2022-10-10 NOTE — Patient Instructions (Addendum)
Medication Instructions:   Continue your current medications.  *If you need a refill on your cardiac medications before your next appointment, please call your pharmacy*  Testing Your physician has requested that you have an echocardiogram in one year. Echocardiography is a painless test that uses sound waves to create images of your heart. It provides your doctor with information about the size and shape of your heart and how well your heart's chambers and valves are working. This procedure takes approximately one hour. There are no restrictions for this procedure. Please do NOT wear cologne, perfume, aftershave, or lotions (deodorant is allowed). Please arrive 15 minutes prior to your appointment time.   Lab Work: Could consider Lipoprotein (a) with your next labs to assess for familial hyperlipidemia.   Follow-Up: At [facility], you and your health needs are our priority.  As part of our continuing mission to provide you with exceptional heart care, we have created designated Provider Care Teams.  These Care Teams include your primary Cardiologist (physician) and Advanced Practice Providers (APPs -  Physician Assistants and Nurse Practitioners) who all work together to provide you with the care you need, when you need it.  We recommend signing up for the patient portal called "MyChart".  Sign up information is provided on this After Visit Summary.  MyChart is used to connect with patients for Virtual Visits (Telemedicine).  Patients are able to view lab/test results, encounter notes, upcoming appointments, etc.  Non-urgent messages can be sent to your provider as well.   To learn more about what you can do with MyChart, go to [email or web address].    Your next appointment:   1 year(s)  Provider:   Chilton Si, MD or Gillian Shields, NP    Other Instructions  [email or web address]  Foods that help cholesterol Fish (salmon) Nuts (almonds) Avocado and  avocado oil Olive oil  Let us know you want to be referred to PREP exercise program       Information About Your Aneurysm  One of your tests has shown an aneurysm of your ascending aorta. The word "aneurysm" refers to a bulge in an artery (blood vessel). Most people think of them in the context of an emergency, but yours was found incidentally. At this point there is nothing you need to do from a procedure standpoint, but there are some important things to keep in mind for day-to-day life.  Mainstays of therapy for aneurysms include very good blood pressure control, healthy lifestyle, and avoiding tobacco products and street drugs. Research has raised concern that antibiotics in the fluoroquinolone class could be associated with increased risk of having an aneurysm develop or tear. This includes medicines that end in "floxacin," like Cipro or Levaquin. Make sure to discuss this information with other healthcare providers if you require antibiotics.  Since aneurysms can run in families, you should discuss your diagnosis with first degree relatives as they may need to be screened for this. Regular mild-moderate physical exercise is important, but avoid heavy lifting/weight lifting over 30lbs, chopping wood, shoveling snow or digging heavy earth with a shovel. It is best to avoid activities that cause grunting or straining (medically referred to as a "Valsalva maneuver"). This happens when a person bears down against a closed throat to increase the strength of arm or abdominal muscles. There's often a tendency to do this when lifting heavy weights, doing sit-ups, push-ups or chin-ups, etc., but it may be harmful.  This is a finding I would expect to be monitored  periodically by your cardiology team. Most unruptured thoracic aortic aneurysms cause no symptoms, so they are often found during exams for other conditions. Contact a health care provider if you develop any discomfort in your upper back, neck,  abdomen, trouble swallowing, cough or hoarseness, or unexplained weight loss. Get help right away if you develop severe pain in your upper back or abdomen that may move into your chest and arms, or any other concerning symptoms such as shortness of breath or fever.

## 2022-10-10 NOTE — Progress Notes (Unsigned)
Cardiology Office Note:  .   Date:  10/10/2022  ID:  Megan Duke, DOB December 23, 1955, MRN [id number] PCP: Laurann Montana, MD  [facility] Providers Cardiologist:  None { Click to update primary MD,subspecialty MD or APP then REFRESH:1}   History of Present Illness: Megan Duke is a 67 y.o. female with hx of bicuspid aortic valve s/p AVR, HLD, morbid obesity, hypothyroidism.   Systolic murmur noted in 2018 with echo at that time LVEF 55-60%, gr1DD, severe AS, mild ascending aortic aneurysm. Coronary CTA revealed no coronary disease. 01/2018 underwent 27mm Edwards Inspiris Resilia pericardial aortic valve. Postop complicated by temporary PPM but no permanent device needed. Repeat echo 01/2019 LVEF 55-60% with mean gradient 14 mmHg, mild dilation of ascending aorta 41mm.  Most recent echo 10/02/22 LVEF 60-65%, LV mildly dilated, no RWMA, Mild MR, mild calcification of AV, prosthesis with no significant PVC (mean gradient ), ascending aortic aneurysm 44mm.   Coronary calcium score 10/2022 cororonary calcium score 0, mild dilation ascending aorta 43mm.   Presents today for follow up.  BP at home   LDL 151, HDL 57, total 227  Husband in October Recumbent bike 30 mintues 5 dcay sper week - now chair aerobics  ROS: Please see the history of present illness.    All other systems reviewed and are negative.   Studies Reviewed: .        Cardiac Studies & Procedures       ECHOCARDIOGRAM  ECHOCARDIOGRAM COMPLETE 10/02/2022  Narrative ECHOCARDIOGRAM REPORT    Patient Name:   Megan Duke [facility] Date of Exam: 10/02/2022 Medical Rec #:  [id number]         Height:       67.0 in Accession #:    [id number]        Weight:       241.2 lb Date of Birth:  12-Dec-1955        BSA:          2.190 m Patient Age:    66 years          BP:           125/80 mmHg Patient Gender: F                 HR:           74 bpm. Exam Location:  Outpatient  Procedure: 2D Echo, 3D Echo, Color  Doppler, Cardiac Doppler and Strain Analysis  Indications:    S/P aortic valve replacement  History:        Patient has prior history of Echocardiogram examinations. Risk Factors:Dyslipidemia and Non-Smoker. Mild ascending aortic aneurysm; bioprosthetic aortic valve replacement with a 27 mm Inspiris bovine pericardial tissue valve by Dr. Laneta Simmers on 01/11/18; history of bicuspid aov.  Sonographer:    Jeryl Columbia RDCS Referring Phys: [id number] [place]  IMPRESSIONS   1. Left ventricular ejection fraction, by estimation, is 60 to 65%. The left ventricle has normal function. The left ventricle has no regional wall motion abnormalities. The left ventricular internal cavity size was mildly dilated. Left ventricular diastolic parameters were normal. The average left ventricular global longitudinal strain is -23.0 %. The global longitudinal strain is normal. 2. Right ventricular systolic function is normal. The right ventricular size is normal. There is normal pulmonary artery systolic pressure. 3. The mitral valve is degenerative. Mild mitral valve regurgitation. 4. V max 2.1 m/s, AV mean gradient 10 mmHg. No significant PVL. Marland Kitchen There is  mild calcification of the aortic valve. Aortic valve regurgitation is not visualized. 5. Aneurysm of the ascending aorta, measuring 44 mm. 6. The inferior vena cava is normal in size with greater than 50% respiratory variability, suggesting right atrial pressure of 3 mmHg.  FINDINGS Left Ventricle: Left ventricular ejection fraction, by estimation, is 60 to 65%. The left ventricle has normal function. The left ventricle has no regional wall motion abnormalities. The average left ventricular global longitudinal strain is -23.0 %. The global longitudinal strain is normal. The left ventricular internal cavity size was mildly dilated. There is no left ventricular hypertrophy. Left ventricular diastolic parameters were normal.  Right Ventricle: The right  ventricular size is normal. Right ventricular systolic function is normal. There is normal pulmonary artery systolic pressure. The tricuspid regurgitant velocity is 2.47 m/s, and with an assumed right atrial pressure of 3 mmHg, the estimated right ventricular systolic pressure is 27.4 mmHg.  Left Atrium: Left atrial size was normal in size.  Right Atrium: Right atrial size was normal in size.  Pericardium: There is no evidence of pericardial effusion.  Mitral Valve: The mitral valve is degenerative in appearance. Mild mitral valve regurgitation.  Tricuspid Valve: The tricuspid valve is grossly normal. Tricuspid valve regurgitation is mild.  Aortic Valve: V max 2.1 m/s, AV mean gradient 10 mmHg. No significant PVL. There is mild calcification of the aortic valve. Aortic valve regurgitation is not visualized. Aortic valve mean gradient measures 10.0 mmHg. Aortic valve peak gradient measures 17.6 mmHg. Aortic valve area, by VTI measures 1.14 cm. There is a bovine valve present in the aortic position.  Pulmonic Valve: Pulmonic valve regurgitation is mild.  Aorta: There is an aneurysm involving the ascending aorta measuring 44 mm.  Venous: The inferior vena cava is normal in size with greater than 50% respiratory variability, suggesting right atrial pressure of 3 mmHg.  IAS/Shunts: No atrial level shunt detected by color flow Doppler.   LEFT VENTRICLE PLAX 2D LVIDd:         4.49 cm   Diastology LVIDs:         2.75 cm   LV e' medial:    6.53 cm/s LV PW:         1.27 cm   LV E/e' medial:  11.8 LV IVS:        0.99 cm   LV e' lateral:   8.16 cm/s LVOT diam:     2.10 cm   LV E/e' lateral: 9.5 LV SV:         53 LV SV Index:   24        2D Longitudinal Strain LVOT Area:     3.46 cm  2D Strain GLS Avg:     -23.0 %  3D Volume EF: 3D EF:        62 % LV EDV:       162 ml LV ESV:       62 ml LV SV:        100 ml  RIGHT VENTRICLE RV Basal diam:  5.06 cm RV Mid diam:    3.97 cm RV S  prime:     12.10 cm/s TAPSE (M-mode): 2.2 cm  LEFT ATRIUM             Index        RIGHT ATRIUM           Index LA diam:        3.25 cm 1.48 cm/m   RA  Area:     22.30 cm LA Vol (A2C):   36.4 ml 16.62 ml/m  RA Volume:   68.00 ml  31.05 ml/m LA Vol (A4C):   55.1 ml 25.16 ml/m LA Biplane Vol: 47.8 ml 21.83 ml/m AORTIC VALVE AV Area (Vmax):    1.16 cm AV Area (Vmean):   1.09 cm AV Area (VTI):     1.14 cm AV Vmax:           210.00 cm/s AV Vmean:          142.000 cm/s AV VTI:            0.464 m AV Peak Grad:      17.6 mmHg AV Mean Grad:      10.0 mmHg LVOT Vmax:         70.20 cm/s LVOT Vmean:        44.600 cm/s LVOT VTI:          0.153 m LVOT/AV VTI ratio: 0.33  AORTA Ao Root diam: 3.70 cm Ao Asc diam:  4.20 cm  MITRAL VALVE               TRICUSPID VALVE MV Area (PHT): 3.91 cm    TR Peak grad:   24.4 mmHg MV Decel Time: 194 msec    TR Vmax:        247.00 cm/s MV E velocity: 77.20 cm/s MV A velocity: 59.10 cm/s  SHUNTS MV E/A ratio:  1.31        Systemic VTI:  0.15 m Systemic Diam: 2.10 cm  Carolan Clines Electronically signed by Carolan Clines Signature Date/Time: 10/02/2022/10:56:39 AM    Final   TEE  ECHO TEE 01/11/2018  Interpretation Summary  Septum: No Patent Foramen Ovale present.  Left atrium: Patent foramen ovale not present.  Aortic valve: The valve is possible bicuspid. Severe valve thickening present. Severe valve calcification present. Severely decreased leaflet separation. Moderate to severe stenosis. Moderate regurgitation.  Aorta: The aortic root is mildly dilated at the sinus of Valsalva. The ascending aorta is mildly dilated.  Mitral valve: No leaflet thickening and calcification present. Mild regurgitation.  Right ventricle: Normal cavity size, wall thickness and ejection fraction. No thrombus present. No mass present. Catheter present in the ventricle.  Tricuspid valve: Mild regurgitation.  Pulmonic valve: Trace regurgitation.    CT  SCANS  CT CARDIAC SCORING (SELF PAY ONLY) 10/03/2022  Addendum 10/09/2022  8:16 PM ADDENDUM REPORT: 10/09/2022 20:13  EXAM: OVER-READ INTERPRETATION  CT CHEST  The following report is an over-read performed by radiologist Dr. Narda Rutherford of [facility] on 10/09/2022. This over-read does not include interpretation of cardiac or coronary anatomy or pathology. The coronary calcium score interpretation by the cardiologist is attached.  COMPARISON:  Cardiac CT 10/14/2017  FINDINGS: Vascular: Aortic atherosclerosis. Dilating ascending aorta at 4.3 cm.  Mediastinum/nodes: No adenopathy or mass. Unremarkable esophagus.  Lungs: No focal airspace disease. No pulmonary nodule. No pleural fluid. The included airways are patent.  Upper abdomen: No acute or unexpected findings. Tiny hepatic cyst, stable.  Musculoskeletal: There are no acute or suspicious osseous abnormalities.  IMPRESSION: 1. Dilated ascending aorta at 4.3 cm. Recommend annual imaging followup by CTA or MRA. This recommendation follows 2010 ACCF/AHA/AATS/ACR/ASA/SCA/SCAI/SIR/STS/SVM Guidelines for the Diagnosis and Management of Patients with Thoracic Aortic Disease. Circulation. 2010; 121: V956-L875. Aortic aneurysm NOS (ICD10-I71.9) 2.  Aortic Atherosclerosis (ICD10-I70.0).   Electronically Signed By: Narda Rutherford M.D. On: 10/09/2022 20:13  Narrative CLINICAL DATA:  Risk  stratification: 67 Year-old Female  EXAM: Coronary Calcium Score  TECHNIQUE: The patient was scanned on a [employer]. Axial non-contrast 3 mm slices were carried out through the heart. The data set was analyzed on a dedicated work station and scored using the Agatson method.  FINDINGS: Non-cardiac: See separate report from [facility].  Ascending Aorta: Evidence of mild ascending aortic dilation, 43 mm, on non-contrasted study.  Aortic Valve Prosthesis: 27 mm Edwards Inspiris Resila valve  without clear calcified pannus.  Mitral annular calcification: No calcification.  Pericardium: Small anterior calcification likely related to prior cardiac surgery.  Coronary arteries: Normal origins. There is a small aortic calcification 1 cm above the take-off of the right coronary artery that likely is related to prior transverse aortotomy.  Coronary Calcium Score:  Left main: 0  Left anterior descending artery: 0  Left circumflex artery: 0  Right coronary artery: 0  Total: 0  Percentile: 1st for age, sex, and race matched control.  IMPRESSION: 1. Coronary calcium score of 0.  2. Bioprosthetic valve that is incompletely imaged.  3. Evidence of mild ascending aortic dilation, 43 mm, on non-contrasted study. Consider secondary imaging modality (echocardiogram, CTA Aorta Protocol, MRA Aorta Protocol) if clinically indicated.  RECOMMENDATIONS:  The proposed cut-off value of 1,651 AU yielded a 93 % sensitivity and 75 % specificity in grading AS severity in patients with classical low-flow, low-gradient AS. Proposed different cut-off values to define severe AS for men and women as 2,065 AU and 1,274 AU, respectively. The joint European and American recommendations for the assessment of AS consider the aortic valve calcium score as a continuum - a very high calcium score suggests severe AS and a low calcium score suggests severe AS is unlikely.  Sunday Shams, et al. 2017 ESC/EACTS Guidelines for the management of valvular heart disease. Eur Heart J 2017;38:2739-91.  Coronary artery calcium (CAC) score is a strong predictor of incident coronary heart disease (CHD) and provides predictive information beyond traditional risk factors. CAC scoring is reasonable to use in the decision to withhold, postpone, or initiate statin therapy in intermediate-risk or selected borderline-risk asymptomatic adults (age 29-75 years and LDL-C >=70 to <190 mg/dL) who  do not have diabetes or established atherosclerotic cardiovascular disease (ASCVD).* In intermediate-risk (10-year ASCVD risk >=7.5% to <20%) adults or selected borderline-risk (10-year ASCVD risk >=5% to <7.5%) adults in whom a CAC score is measured for the purpose of making a treatment decision the following recommendations have been made:  If CAC = 0, it is reasonable to withhold statin therapy and reassess in 5 to 10 years, as long as higher risk conditions are absent (diabetes mellitus, family history of premature CHD in first degree relatives (males <55 years; females <65 years), cigarette smoking, LDL >=190 mg/dL or other independent risk factors).  If CAC is 1 to 99, it is reasonable to initiate statin therapy for patients >=49 years of age.  If CAC is >=100 or >=75th percentile, it is reasonable to initiate statin therapy at any age.  Cardiology referral should be considered for patients with CAC scores =400 or >=75th percentile.  *2018 AHA/ACC/AACVPR/AAPA/ABC/ACPM/ADA/AGS/APhA/ASPC/NLA/PCNA Guideline on the Management of Blood Cholesterol: A Report of the American College of Cardiology/American Heart Association Task Force on Clinical Practice Guidelines. J Am Coll Cardiol. 2019;73(24):3168-3209.  Riley Lam, MD  Electronically Signed: By: Riley Lam M.D. On: 10/03/2022 12:11   CT SCANS  CT CORONARY MORPH W/CTA COR W/SCORE 10/14/2017  Addendum 10/14/2017  3:35 PM ADDENDUM REPORT: 10/14/2017 15:33  CLINICAL DATA:  67 year old female with severe aortic stenosis being evaluated for AVR.  EXAM: Cardiac TAVR CT  TECHNIQUE: The patient was scanned on a [employer]. A 120 kV retrospective scan was triggered in the descending thoracic aorta at 111 HU's. Gantry rotation speed was 250 msecs and collimation was .6 mm. No beta blockade or nitro were given. The 3D data set was reconstructed in 5% intervals of the R-R cycle. Systolic  and diastolic phases were analyzed on a dedicated work station using MPR, MIP and VRT modes. The patient received 80 cc of contrast.  FINDINGS: Aortic Valve: Functionally bicuspid aortic valve with co-joined left and right coronary leaflets. Leaflets are severely thickened and calcified with severely restricted leaflet opening in systole. Aortic valve is dilated at the sinus level with maximum diameter 42 mm.  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 Normal size of the aortic arch and descending aorta. Mild diffuse atheroma and calcifications. No dissection.  Sinotubular Junction: 38 x 36 mm  Ascending Thoracic Aorta: 41 x 41 mm  Aortic Arch: 28 x 25 mm  Descending Thoracic Aorta: 23 x 22 mm  Sinus of Valsalva Measurements:  Non-coronary: 41 mm  Right -coronary: 37 mm  Left -coronary:  Coronary Artery Height above Annulus:  Left Main: 14 mm  Right Coronary: 19 mm  Virtual Basal Annulus Measurements:  Maximum/Minimum Diameter: 33.6 x 27.8 mm  Mean Diameter: 29.8 mm  Perimeter: 98.1 mm  Area: 697 mm2  Coronary Arteries: Normal coronary origin. Right dominance. Calcium score is 0.  RCA is a large artery that gives rise to PDA and PLA. There is no plaque.  Left main gives rise to LAD and LCX arteries and has no plaque.  LAD gives rise to one diagonal artery and has minimal non-obstructive irregularities.  LCX is a non-dominant artery that gives rise to one OM branch and has no plaque.  IMPRESSION: 1. 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 Leaflets are severely thickened and calcified with severely restricted leaflet opening in systole.  2. 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  3. Normal coronary origin. Right dominance. Calcium score is 0.  No CAD.  4. No thrombus in the left atrial appendage.   Electronically Signed By: Tobias Alexander On: 10/14/2017 15:33  Narrative EXAM: OVER-READ INTERPRETATION  CT CHEST  The following report is an over-read performed by radiologist Dr. Charlett Nose of [facility] on 10/14/2017. This over-read does not include interpretation of cardiac or coronary anatomy or pathology. The coronary CTA interpretation by the cardiologist is attached.  COMPARISON:  None.  FINDINGS: Vascular: Mild aneurysmal dilatation of the ascending thoracic aorta, 4 cm maximally. Heart is normal size.  Mediastinum/Nodes: No adenopathy in the mediastinum or hila.  Lungs/Pleura: Visualized lungs clear.  No effusions.  Upper Abdomen: Imaging into the upper abdomen shows no acute findings.  Musculoskeletal: Chest wall soft tissues are unremarkable. No acute bony abnormality.  IMPRESSION: Mild ascending thoracic aortic aneurysm, 4 cm. Recommend annual imaging followup by CTA or MRA. This recommendation follows 2010 ACCF/AHA/AATS/ACR/ASA/SCA/SCAI/SIR/STS/SVM Guidelines for the Diagnosis and Management of Patients with Thoracic Aortic Disease. Circulation. 2010; 121: X528-U132  Electronically Signed: By: Charlett Nose M.D. On: 10/14/2017 10:04  Risk Assessment/Calculations:             Physical Exam:   VS:  BP 128/78   Pulse 85   Ht 5\' 7"  (1.702 m)   Wt 245 lb (111.1 kg)   BMI 38.37 kg/m    Wt Readings from Last 3 Encounters:  10/10/22 245 lb (111.1 kg)  10/08/21 241 lb 3.2 oz (109.4 kg)  05/16/20 232 lb 9.6 oz (105.5 kg)    GEN: Well nourished, well developed in no acute distress NECK: No JVD; No carotid bruits CARDIAC: RRR, no murmurs, rubs, gallops RESPIRATORY:  Clear to auscultation without rales, wheezing or rhonchi  ABDOMEN: Soft, non-tender, non-distended EXTREMITIES:  No edema; No deformity   ASSESSMENT AND PLAN: .    Bicuspid AV s/p bioprosthetic AVR -   Continue SBE prophylaxis with Amoxicillin.  Ascending arotic aneurysm - Continue optimal BP control. Discussed addition of possible beta blocker, she is hesitant regarding additional medications at this time. Repeat echo in 1 year.   HLD - Prior 2019 calcium score 0. Did not tolerate Rosuvastatin. Discussed dietary changes to help lower cholesterol. Plans to consider increasing physical activity.  She will contact us if interested in PREP exercise program. Could consider lipoprotein (a) with next labs       Dispo: follow up in 1 year  Signed, Alver Sorrow, NP

## 2022-10-15 ENCOUNTER — Encounter (HOSPITAL_BASED_OUTPATIENT_CLINIC_OR_DEPARTMENT_OTHER): Payer: Self-pay | Admitting: Family

## 2022-10-17 ENCOUNTER — Ambulatory Visit
Admission: RE | Admit: 2022-10-17 | Discharge: 2022-10-17 | Disposition: A | Discharge status: Home / Self Care | Payer: Medicare PPO | Source: Ambulatory Visit | Attending: Obstetrics and Gynecology | Admitting: Obstetrics and Gynecology

## 2022-10-17 DIAGNOSIS — E349 Endocrine disorder, unspecified: Secondary | ICD-10-CM | POA: Diagnosis not present

## 2022-10-17 DIAGNOSIS — N958 Other specified menopausal and perimenopausal disorders: Secondary | ICD-10-CM | POA: Diagnosis not present

## 2022-10-17 DIAGNOSIS — M8588 Other specified disorders of bone density and structure, other site: Secondary | ICD-10-CM | POA: Diagnosis not present

## 2022-10-17 DIAGNOSIS — Z13820 Encounter for screening for osteoporosis: Secondary | ICD-10-CM

## 2023-03-31 ENCOUNTER — Other Ambulatory Visit: Payer: Self-pay | Admitting: Obstetrics and Gynecology

## 2023-03-31 DIAGNOSIS — Z1231 Encounter for screening mammogram for malignant neoplasm of breast: Secondary | ICD-10-CM

## 2023-05-13 DIAGNOSIS — E669 Obesity, unspecified: Secondary | ICD-10-CM | POA: Diagnosis not present

## 2023-05-13 DIAGNOSIS — Z860101 Personal history of adenomatous and serrated colon polyps: Secondary | ICD-10-CM | POA: Diagnosis not present

## 2023-05-13 DIAGNOSIS — E039 Hypothyroidism, unspecified: Secondary | ICD-10-CM | POA: Diagnosis not present

## 2023-05-13 DIAGNOSIS — Z9889 Other specified postprocedural states: Secondary | ICD-10-CM | POA: Diagnosis not present

## 2023-05-13 DIAGNOSIS — Z87898 Personal history of other specified conditions: Secondary | ICD-10-CM | POA: Diagnosis not present

## 2023-05-13 DIAGNOSIS — E559 Vitamin D deficiency, unspecified: Secondary | ICD-10-CM | POA: Diagnosis not present

## 2023-05-13 DIAGNOSIS — Z0000 Encounter for general adult medical examination without abnormal findings: Secondary | ICD-10-CM | POA: Diagnosis not present

## 2023-05-13 DIAGNOSIS — E049 Nontoxic goiter, unspecified: Secondary | ICD-10-CM | POA: Diagnosis not present

## 2023-05-13 DIAGNOSIS — E785 Hyperlipidemia, unspecified: Secondary | ICD-10-CM | POA: Diagnosis not present

## 2023-06-03 DIAGNOSIS — E559 Vitamin D deficiency, unspecified: Secondary | ICD-10-CM | POA: Diagnosis not present

## 2023-06-03 DIAGNOSIS — Z1231 Encounter for screening mammogram for malignant neoplasm of breast: Secondary | ICD-10-CM | POA: Diagnosis not present

## 2023-06-03 DIAGNOSIS — Z124 Encounter for screening for malignant neoplasm of cervix: Secondary | ICD-10-CM | POA: Diagnosis not present

## 2023-06-03 DIAGNOSIS — Z6839 Body mass index (BMI) 39.0-39.9, adult: Secondary | ICD-10-CM | POA: Diagnosis not present

## 2023-06-03 DIAGNOSIS — Z01419 Encounter for gynecological examination (general) (routine) without abnormal findings: Secondary | ICD-10-CM | POA: Diagnosis not present

## 2023-06-03 DIAGNOSIS — Z1330 Encounter for screening examination for mental health and behavioral disorders, unspecified: Secondary | ICD-10-CM | POA: Diagnosis not present

## 2023-06-03 DIAGNOSIS — Z1211 Encounter for screening for malignant neoplasm of colon: Secondary | ICD-10-CM | POA: Diagnosis not present

## 2023-06-03 DIAGNOSIS — M8580 Other specified disorders of bone density and structure, unspecified site: Secondary | ICD-10-CM | POA: Diagnosis not present

## 2023-06-04 ENCOUNTER — Ambulatory Visit
Admission: RE | Admit: 2023-06-04 | Discharge: 2023-06-04 | Disposition: A | Discharge status: Home / Self Care | Payer: Medicare PPO | Source: Ambulatory Visit | Attending: Obstetrics and Gynecology | Admitting: Obstetrics and Gynecology

## 2023-06-04 DIAGNOSIS — Z1231 Encounter for screening mammogram for malignant neoplasm of breast: Secondary | ICD-10-CM

## 2023-10-07 ENCOUNTER — Ambulatory Visit (HOSPITAL_BASED_OUTPATIENT_CLINIC_OR_DEPARTMENT_OTHER): Payer: Self-pay | Admitting: Family

## 2023-10-07 ENCOUNTER — Ambulatory Visit (HOSPITAL_BASED_OUTPATIENT_CLINIC_OR_DEPARTMENT_OTHER)

## 2023-10-07 DIAGNOSIS — I7121 Aneurysm of the ascending aorta, without rupture: Secondary | ICD-10-CM | POA: Diagnosis not present

## 2023-10-07 LAB — ECHOCARDIOGRAM COMPLETE
AR max vel: 1.96 cm2
AV Area VTI: 2.19 cm2
AV Area mean vel: 1.95 cm2
AV Mean grad: 13 mmHg
AV Peak grad: 23.8 mmHg
Ao pk vel: 2.44 m/s
Area-P 1/2: 3.48 cm2
S' Lateral: 2.72 cm

## 2023-10-12 NOTE — Progress Notes (Signed)
 Cardiology Office Note:  .   Date:  10/13/2023  ID:  Megan Duke, DOB Aug 13, 1955, MRN [id number] PCP: Teresa Channel, MD  [facility] Providers Cardiologist:  None    History of Present Illness: Megan Duke is a 68 y.o. female with hx of bicuspid aortic valve s/p AVR, HLD, morbid obesity, hypothyroidism.   Systolic murmur noted in 2018 with echo at that time LVEF 55-60%, gr1DD, severe AS, mild ascending aortic aneurysm. Coronary CTA revealed no coronary disease. 01/2018 underwent 27mm Edwards Inspiris Resilia pericardial aortic valve. Postop complicated by temporary PPM but no permanent device needed. Repeat echo 01/2019 LVEF 55-60% with mean gradient 14 mmHg, mild dilation of ascending aorta 41mm.  Most recent echo 10/02/22 LVEF 60-65%, LV mildly dilated, no RWMA, Mild MR, mild calcification of AV, prosthesis with no significant PVC (mean gradient ), ascending aortic aneurysm 44mm.   Echo 10/2022 echo ascending aorta 44 mm. Coronary calcium  score 10/2022 cororonary calcium  score 0, mild dilation ascending aorta 43mm. At visit 10/10/22 she was doing well from a cardiac perspective. Echo 10/07/23 LVEF 60-65%, RV normal, mild MR, normal structure and function of AV prosthesis, ascending aorta 45mm.   Presents today for follow up.Exercising regularly at the [facility]. Reports no shortness of breath nor dyspnea on exertion. Reports no chest pain, pressure, or tightness. No edema, orthopnea, PND. Reports no palpitations.  She continues to adhere to low sodium, heart healthy diet hough notes difficulty with losing weight despite this.   ROS: Please see the history of present illness.    All other systems reviewed and are negative.   Studies Reviewed: SABRA   EKG Interpretation Date/Time:  Tuesday October 13 2023 09:28:20 EDT Ventricular Rate:  74 PR Interval:  194 QRS Duration:  128 QT Interval:  386 QTC Calculation: 428 R Axis:   11  Text Interpretation: Normal sinus rhythm  Right bundle branch block  Stable from previous. Confirmed by Vannie Mora ([id number]) on 10/13/2023 9:47:15 AM       Risk Assessment/Calculations:             Physical Exam:   VS:  BP 124/76   Pulse 74   Ht 5' 6 (1.676 m)   Wt 247 lb 9.6 oz (112.3 kg)   SpO2 100%   BMI 39.96 kg/m    Wt Readings from Last 3 Encounters:  10/13/23 247 lb 9.6 oz (112.3 kg)  10/10/22 245 lb (111.1 kg)  10/08/21 241 lb 3.2 oz (109.4 kg)    GEN: Well nourished, well developed in no acute distress NECK: No JVD; No carotid bruits CARDIAC: RRR, no murmurs, rubs, gallops RESPIRATORY:  Clear to auscultation without rales, wheezing or rhonchi  ABDOMEN: Soft, non-tender, non-distended EXTREMITIES:  No edema; No deformity   ASSESSMENT AND PLAN: .    Bicuspid AV s/p bioprosthetic AVR - Functioning appropriately by echo 10/2023. Continue optimal BP control. Continue SBE prophylaxis with Amoxicillin , refill provided.Plan for repeat echo 10/2025, can be ordered at follow up.   Ascending arotic aneurysm - Continue optimal BP control. CT 10/2022 ascending aorta 4.3 cm ? Echo 10/2023 ascending aorta 45mm. As BP controlled and aorta stable, beta blocker has previously been deferred as she prefers to avoid additional medical therapies. Repeat echo in 2 years for monitoring, can be ordered at follow up   HLD - Prior 2019 calcium  score 0. Did not tolerate Rosuvastatin . Discussed dietary changes to help lower cholesterol. Plans to consider increasing physical activity. 10-year ASCVD  risk score 6.9% today which is borderline. Plan for continued lifestyle interventions to lower cholesterol. Recommend aiming for 150 minutes of moderate intensity activity per week and following a heart healthy diet.  If elevated ASCVD risk or LDL >190 in future, may need to consider medical therapy. Discussed alternatives to statin such as Zetia or Bempedoic Acid.   RBBB - stable finding on EKG. Monitor with periodic EKG. No lightheadedness,  dizziness.        Dispo: follow up in 1 year  Signed, Reche GORMAN Finder, NP

## 2023-10-13 ENCOUNTER — Encounter (HOSPITAL_BASED_OUTPATIENT_CLINIC_OR_DEPARTMENT_OTHER): Payer: Self-pay | Admitting: Family

## 2023-10-13 ENCOUNTER — Ambulatory Visit (HOSPITAL_BASED_OUTPATIENT_CLINIC_OR_DEPARTMENT_OTHER): Admitting: Family

## 2023-10-13 VITALS — BP 124/76 | HR 74 | Ht 66.0 in | Wt 247.6 lb

## 2023-10-13 DIAGNOSIS — I4510 Unspecified right bundle-branch block: Secondary | ICD-10-CM | POA: Diagnosis not present

## 2023-10-13 DIAGNOSIS — Z952 Presence of prosthetic heart valve: Secondary | ICD-10-CM

## 2023-10-13 DIAGNOSIS — I7121 Aneurysm of the ascending aorta, without rupture: Secondary | ICD-10-CM

## 2023-10-13 MED ORDER — AMOXICILLIN 500 MG PO TABS: ORAL_TABLET | ORAL | 4 refills | Status: AC

## 2023-10-13 NOTE — Patient Instructions (Addendum)
 Medication Instructions:  Continue your current medications  *If you need a refill on your cardiac medications before your next appointment, please call your pharmacy*  Testing/Procedures: We will plan for echo 10/2025  Follow-Up: At [facility], you and your health needs are our priority.  As part of our continuing mission to provide you with exceptional heart care, our providers are all part of one team.  This team includes your primary Cardiologist (physician) and Advanced Practice Providers or APPs (Physician Assistants and Nurse Practitioners) who all work together to provide you with the care you need, when you need it.  Your next appointment:   1 year(s)  Provider:   Annabella Scarce, MD, Rosaline Bane, NP, or Reche Finder, NP    We recommend signing up for the patient portal called MyChart.  Sign up information is provided on this After Visit Summary.  MyChart is used to connect with patients for Virtual Visits (Telemedicine).  Patients are able to view lab/test results, encounter notes, upcoming appointments, etc.  Non-urgent messages can be sent to your provider as well.   To learn more about what you can do with MyChart, go to [email or web address].   Other Instructions  Heart Healthy Diet Recommendations: A low-salt diet is recommended. Meats should be grilled, baked, or boiled. Avoid fried foods. Focus on lean protein sources like fish or chicken with vegetables and fruits. The American Heart Association is a Chief Technology Officer!  American Heart Association Diet and Lifeystyle Recommendations   Exercise recommendations: The American Heart Association recommends 150 minutes of moderate intensity exercise weekly. Try 30 minutes of moderate intensity exercise 4-5 times per week. This could include walking, jogging, or swimming.

## 2023-12-28 DIAGNOSIS — L814 Other melanin hyperpigmentation: Secondary | ICD-10-CM | POA: Diagnosis not present

## 2023-12-28 DIAGNOSIS — D1801 Hemangioma of skin and subcutaneous tissue: Secondary | ICD-10-CM | POA: Diagnosis not present

## 2023-12-28 DIAGNOSIS — L821 Other seborrheic keratosis: Secondary | ICD-10-CM | POA: Diagnosis not present

## 2023-12-28 DIAGNOSIS — D229 Melanocytic nevi, unspecified: Secondary | ICD-10-CM | POA: Diagnosis not present

## 2023-12-28 DIAGNOSIS — I781 Nevus, non-neoplastic: Secondary | ICD-10-CM | POA: Diagnosis not present

## 2023-12-28 DIAGNOSIS — L578 Other skin changes due to chronic exposure to nonionizing radiation: Secondary | ICD-10-CM | POA: Diagnosis not present
